# Patient Record
Sex: Male | Born: 1990 | Race: Black or African American | Hispanic: No | Marital: Single | Smoking: Never smoker
Health system: Southern US, Community
[De-identification: ages and names within clinical notes are randomized; demographics above are authoritative.]

## PROBLEM LIST (undated history)

## (undated) DIAGNOSIS — R066 Hiccough: Secondary | ICD-10-CM

## (undated) DIAGNOSIS — T7840XA Allergy, unspecified, initial encounter: Secondary | ICD-10-CM

## (undated) DIAGNOSIS — K219 Gastro-esophageal reflux disease without esophagitis: Secondary | ICD-10-CM

## (undated) HISTORY — DX: Hiccough: R06.6

## (undated) HISTORY — DX: Gastro-esophageal reflux disease without esophagitis: K21.9

## (undated) HISTORY — PX: NO PAST SURGERIES: SHX2092

## (undated) HISTORY — DX: Allergy, unspecified, initial encounter: T78.40XA

---
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
## 2011-02-09 ENCOUNTER — Other Ambulatory Visit (HOSPITAL_COMMUNITY): Payer: Self-pay | Admitting: Sports Medicine

## 2011-02-09 DIAGNOSIS — M25561 Pain in right knee: Secondary | ICD-10-CM

## 2011-02-12 ENCOUNTER — Inpatient Hospital Stay (HOSPITAL_COMMUNITY): Admission: RE | Admit: 2011-02-12 | Payer: 59 | Source: Ambulatory Visit

## 2011-03-02 ENCOUNTER — Ambulatory Visit (HOSPITAL_COMMUNITY)
Admission: RE | Admit: 2011-03-02 | Discharge: 2011-03-02 | Disposition: A | Discharge status: Home / Self Care | Payer: 59 | Source: Ambulatory Visit | Attending: Sports Medicine | Admitting: Sports Medicine

## 2011-03-02 DIAGNOSIS — M25561 Pain in right knee: Secondary | ICD-10-CM

## 2011-03-02 DIAGNOSIS — M25569 Pain in unspecified knee: Secondary | ICD-10-CM | POA: Insufficient documentation

## 2011-05-03 ENCOUNTER — Emergency Department (INDEPENDENT_AMBULATORY_CARE_PROVIDER_SITE_OTHER)
Admission: EM | Admit: 2011-05-03 | Discharge: 2011-05-03 | Disposition: A | Discharge status: Home / Self Care | Payer: 59 | Source: Home / Self Care | Attending: Emergency Medicine | Admitting: Emergency Medicine

## 2011-05-03 ENCOUNTER — Encounter (HOSPITAL_COMMUNITY): Payer: Self-pay | Admitting: *Deleted

## 2011-05-03 DIAGNOSIS — H6120 Impacted cerumen, unspecified ear: Secondary | ICD-10-CM

## 2011-05-03 MED ORDER — NEOMYCIN-POLYMYXIN-HC 3.5-10000-1 OT SUSP: 4.0000 [drp] | Freq: Three times a day (TID) | OTIC | Status: AC

## 2011-05-03 NOTE — ED Notes (Signed)
Pt is here with complaints of left ear pain, denies any additional complaints.

## 2011-05-03 NOTE — Discharge Instructions (Signed)
Cerumen Impaction A cerumen impaction is when the wax in your ear forms a plug. This plug usually causes reduced hearing. Sometimes it also causes an earache or dizziness. Removing a cerumen impaction can be difficult and painful. The wax sticks to the ear canal. The canal is sensitive and bleeds easily. If you try to remove a heavy wax buildup with a cotton tipped swab, you may push it in further. Irrigation with water, suction, and small ear curettes may be used to clear out the wax. If the impaction is fixed to the skin in the ear canal, ear drops may be needed for a few days to loosen the wax. People who build up a lot of wax frequently can use ear wax removal products available in your local drugstore. SEEK MEDICAL CARE IF:  You develop an earache, increased hearing loss, or marked dizziness. Document Released: 04/08/2004 Document Revised: 11/11/2010 Document Reviewed: 05/29/2009 ExitCare Patient Information 2012 [employer]. 

## 2011-05-03 NOTE — ED Provider Notes (Signed)
History     CSN: [id number]  Arrival date & time 05/03/11  0900   First MD Initiated Contact with Patient 05/03/11 [phone]      Chief Complaint  Patient presents with  . Otalgia    (Consider location/radiation/quality/duration/timing/severity/associated sxs/prior treatment) HPI Comments: Nicholas Newman presents to urgent care complaining of left ear discomfort "fullness sensation" for several weeks he describes that he tends to have earwax buildup and he tends to try to clean his ears everyday.  Denies any recent cold-like symptoms fevers or ear discharge. No trauma or  injury. No fevers  Patient is a 21 y.o. male presenting with ear pain. The history is provided by the patient.  Otalgia This is a new problem. The current episode started more than 1 week ago. There is pain in the left ear. The problem occurs constantly. The problem has not changed since onset.There has been no fever. Pertinent negatives include no ear discharge, no headaches, no rhinorrhea, no sore throat and no vomiting. His past medical history does not include chronic ear infection, hearing loss or tympanostomy tube.    History reviewed. No pertinent past medical history.  History reviewed. No pertinent past surgical history.  History reviewed. No pertinent family history.  History  Substance Use Topics  . Smoking status: Never Smoker   . Smokeless tobacco: Not on file  . Alcohol Use: No      Review of Systems  Constitutional: Negative for fever, activity change and appetite change.  HENT: Positive for ear pain. Negative for sore throat, rhinorrhea and ear discharge.   Gastrointestinal: Negative for vomiting.  Neurological: Negative for headaches.    Allergies  Review of patient's allergies indicates no known allergies.  Home Medications  No current outpatient prescriptions on file.  BP 110/73  Pulse 62  Temp(Src) 97.7 F (36.5 C) (Oral)  Resp 14  SpO2 100%  Physical Exam  Nursing note and vitals  reviewed. Constitutional: He appears well-developed and well-nourished. No distress.  HENT:  Head: Normocephalic.  Right Ear: No drainage or swelling. No decreased hearing is noted.  Left Ear: No drainage or swelling. No decreased hearing is noted.  Ears:  Eyes: Conjunctivae are normal.  Neck: Neck supple. No JVD present.  Abdominal: Soft.  Skin: Skin is warm.    ED Course  Procedures (including critical care time)  Labs Reviewed - No data to display No results found.   No diagnosis found.    MDM  Predominantly left ear canal cerumen impaction partial disruption of right ear canal.        Jimmie Molly, MD 05/03/11 1025

## 2012-08-19 ENCOUNTER — Emergency Department (HOSPITAL_COMMUNITY)
Admission: EM | Admit: 2012-08-19 | Discharge: 2012-08-19 | Disposition: A | Discharge status: Home / Self Care | Payer: BC Managed Care – PPO | Source: Home / Self Care | Attending: Emergency Medicine | Admitting: Emergency Medicine

## 2012-08-19 ENCOUNTER — Other Ambulatory Visit (HOSPITAL_COMMUNITY)
Admission: RE | Admit: 2012-08-19 | Discharge: 2012-08-19 | Disposition: A | Discharge status: Home / Self Care | Payer: BC Managed Care – PPO | Source: Ambulatory Visit | Attending: Emergency Medicine | Admitting: Emergency Medicine

## 2012-08-19 ENCOUNTER — Encounter (HOSPITAL_COMMUNITY): Payer: Self-pay

## 2012-08-19 DIAGNOSIS — R300 Dysuria: Secondary | ICD-10-CM

## 2012-08-19 DIAGNOSIS — Z113 Encounter for screening for infections with a predominantly sexual mode of transmission: Secondary | ICD-10-CM | POA: Insufficient documentation

## 2012-08-19 LAB — POCT URINALYSIS DIP (DEVICE)
Bilirubin Urine: NEGATIVE
Glucose, UA: NEGATIVE mg/dL
Leukocytes, UA: NEGATIVE
Nitrite: NEGATIVE
pH: 7 (ref 5.0–8.0)

## 2012-08-19 MED ORDER — PHENAZOPYRIDINE HCL 200 MG PO TABS: 200.0000 mg | ORAL_TABLET | Freq: Three times a day (TID) | ORAL | Status: DC | PRN

## 2012-08-19 NOTE — ED Provider Notes (Signed)
Chief Complaint:   Chief Complaint  Patient presents with  . Urinary Tract Infection    History of Present Illness:   Nicholas Newman is a 22 year old male who has had a three-day history of slight dysuria and pain at the tip of the penis. He denies any frequency, urgency, or hematuria. He has had no fever, chills, nausea, vomiting, lower abdominal, or lower back pain. He denies any urethral discharge. He has noted a few small bumps on his penis that appeared months ago. These have not been getting worse and has been no itching or irritation. The patient states he has not been sexually active since last December.  Review of Systems:  Other than noted above, the patient denies any of the following symptoms: General:  No fevers, chills, sweats, aches, or fatigue. GI:  No abdominal pain, back pain, nausea, vomiting, diarrhea, or constipation. GU:  No dysuria, frequency, urgency, hematuria, urethral discharge, penile lesions, penile pain, testicular pain, swelling, or mass, inguinal lymphadenopathy or incontinence.  PMFSH:  Past medical history, family history, social history, meds, and allergies were reviewed.   Physical Exam:   Vital signs:  BP 126/74  Pulse 67  Temp(Src) 98 F (36.7 C) (Oral)  Resp 17  SpO2 98% Gen:  Alert, oriented, in no distress. Lungs:  Clear to auscultation, no wheezes, rales or rhonchi. Heart:  Regular rhythm, no gallop or murmer. Abdomen:  Flat and soft.  No tenderness to palpation, guarding, or rebound.  No hepato-splenomegaly or mass.  Bowel sounds were normally active.  No hernia. Genital exam:  No urethral discharge. He has some small, light colored bumps on both sides of the penis. These do not look like warts or molluscum. I'm not sure what they are. Back:  No CVA tenderness.  Skin:  Clear, warm and dry.  Labs:   Results for orders placed during the hospital encounter of 08/19/12  POCT URINALYSIS DIP (DEVICE)      Result Value Range   Glucose, UA  NEGATIVE  NEGATIVE mg/dL   Bilirubin Urine NEGATIVE  NEGATIVE   Ketones, ur NEGATIVE  NEGATIVE mg/dL   Specific Gravity, Urine 1.015  1.005 - 1.030   Hgb urine dipstick NEGATIVE  NEGATIVE   pH 7.0  5.0 - 8.0   Protein, ur NEGATIVE  NEGATIVE mg/dL   Urobilinogen, UA 0.2  0.0 - 1.0 mg/dL   Nitrite NEGATIVE  NEGATIVE   Leukocytes, UA NEGATIVE  NEGATIVE    A urine culture was obtained as well as DNA probe for gonorrhea, Chlamydia, Trichomonas.  Assessment: The encounter diagnosis was Dysuria.   No evidence of urinary tract infection. We'll treat symptomatically with Pyridium, increase in fluids, and avoidance of bladder irritants. Return again if symptoms persist.  Plan:   1.  The following meds were prescribed:   Discharge Medication List as of 08/19/2012  3:11 PM    START taking these medications   Details  phenazopyridine (PYRIDIUM) 200 MG tablet Take 1 tablet (200 mg total) by mouth 3 (three) times daily as needed for pain., Starting 08/19/2012, Until Discontinued, Normal       2.  The patient was instructed in symptomatic care and handouts were given. 3.  The patient was told to return if becoming worse in any way, if no better in 3 or 4 days, and given some red flag symptoms such as fever, vomiting, or back pain that would indicate earlier return. 4.  Follow up here as necessary.     Onalee Hua  Vivia Budge, MD 08/19/12 [phone]

## 2012-08-19 NOTE — ED Notes (Signed)
C/o 2nd day duration if pain w urination; denies sexual activity since 02-2012

## 2012-08-20 LAB — URINE CULTURE
Colony Count: NO GROWTH
Culture: NO GROWTH

## 2012-10-24 ENCOUNTER — Encounter: Payer: BC Managed Care – PPO | Admitting: Family Medicine

## 2012-10-24 DIAGNOSIS — Z0289 Encounter for other administrative examinations: Secondary | ICD-10-CM

## 2012-10-25 ENCOUNTER — Telehealth: Payer: Self-pay

## 2012-10-25 NOTE — Telephone Encounter (Signed)
Called and left message informing the patient that he missed his appointment

## 2013-10-21 ENCOUNTER — Emergency Department (INDEPENDENT_AMBULATORY_CARE_PROVIDER_SITE_OTHER): Payer: Managed Care, Other (non HMO)

## 2013-10-21 ENCOUNTER — Encounter (HOSPITAL_COMMUNITY): Payer: Self-pay | Admitting: Emergency Medicine

## 2013-10-21 ENCOUNTER — Emergency Department (HOSPITAL_COMMUNITY)
Admission: EM | Admit: 2013-10-21 | Discharge: 2013-10-21 | Disposition: A | Discharge status: Home / Self Care | Payer: Managed Care, Other (non HMO) | Source: Home / Self Care | Attending: Emergency Medicine | Admitting: Emergency Medicine

## 2013-10-21 DIAGNOSIS — K2210 Ulcer of esophagus without bleeding: Secondary | ICD-10-CM

## 2013-10-21 MED ORDER — GI COCKTAIL ~~LOC~~
30.0000 mL | Freq: Once | ORAL | Status: AC
  Administered 2013-10-21: 30 mL via ORAL

## 2013-10-21 MED ORDER — SUCRALFATE 1 GM/10ML PO SUSP: 1.0000 g | Freq: Four times a day (QID) | ORAL | Status: DC

## 2013-10-21 MED ORDER — GI COCKTAIL ~~LOC~~
ORAL | Status: AC
  Filled 2013-10-21: qty 30

## 2013-10-21 NOTE — ED Notes (Signed)
C/o allergic reaction to antibiotic medication States received meds at hospital when he went for cyst removal States he is sob and has chest tightness meds was started on Tuesday and reaction started on Thursday Denies any vomiting and diarrhea

## 2013-10-21 NOTE — ED Provider Notes (Signed)
Chief Complaint   Chief Complaint  Patient presents with  . Allergic Reaction    History of Present Illness    Nicholas Newman is a 23 year old male who was on doxycycline for an infected cyst on his right flank area. This was incised and drained 6 days ago. He had been taking the doxycycline since then. Wednesday he dry swallows one of the doxycycline capsules. He did not feel it get caught in his esophagus. On Friday night which was 3 days ago he developed chest pain in the lower sternal area, this was rated 10 over 10 in intensity. He felt mildly short of breath. He noted that it hurt to swallow food or liquids. He denies any fever, chills, sore throat, coughing, wheezing, palpitations, dizziness, or syncope. No nausea or diaphoresis. No abdominal pain or vomiting.  Review of Systems    Other than noted above, the patient denies any of the following symptoms. Systemic:  No fever or chills. Pulmonary:  No cough, wheezing, shortness of breath, sputum production, hemoptysis. Cardiac:  No palpitations, rapid heartbeat, dizziness, presyncope or syncope. GI:  No abdominal pain, heartburn, nausea, or vomiting. Ext:  No leg pain or swelling.  [place]    Past medical history, family history, social history, meds, and allergies were reviewed.   Physical Exam     Vital signs:  BP 127/76  Pulse 71  Temp(Src) 98.8 F (37.1 C) (Oral)  Resp 16  SpO2 96% Gen:  Alert, oriented, in no distress, skin warm and dry. Eye:  PERRL, lids and conjunctivas normal.  Sclera non-icteric. ENT:  Mucous membranes moist, pharynx clear. Neck:  Supple, no adenopathy or tenderness.  No JVD. Lungs:  Clear to auscultation, no wheezes, rales or rhonchi.  No respiratory distress. Heart:  Regular rhythm.  No gallops, murmers, clicks or rubs. Chest:  No chest wall tenderness. Abdomen:  Soft, nontender, no organomegaly or mass.  Bowel sounds normal.  No pulsatile abdominal mass or bruit. Ext:  No edema.  No  calf tenderness and Homann's sign negative.  Pulses full and equal. Skin:  Warm and dry.  No rash.   Radiology     Dg Chest 2 View  10/21/2013   CLINICAL DATA:  Shortness of breath and chest tightness for 3 days.  EXAM: CHEST  2 VIEW  COMPARISON:  None.  FINDINGS: The heart size and mediastinal contours are within normal limits. Both lungs are clear. The visualized skeletal structures are unremarkable.  IMPRESSION: No active cardiopulmonary disease.   Electronically Signed   By: Shon Hale M.D.   On: 10/21/2013 13:22   I reviewed the images independently and personally and concur with the radiologist's findings.  EKG Results:  Date: 10/21/2013  Rate: 74  Rhythm: normal sinus rhythm  QRS Axis: normal--82  Intervals: normal  ST/T Wave abnormalities: normal  Conduction Disutrbances:none  Narrative Interpretation: Normal sinus rhythm, normal EKG.  Old EKG Reviewed: none available    Course in Urgent [place]         He was given 30 mL of GI cocktail with complete relief of his pain.  Assessment     The encounter diagnosis was Esophageal ulcer.  Probable esophageal ulcer from a dry swallowing a doxycycline capsule.  Plan     1.  Meds:  The following meds were prescribed:   Discharge Medication List as of 10/21/2013  1:36 PM    START taking these medications   Details  sucralfate (CARAFATE) 1 GM/10ML suspension Take 10 mLs (1 g total) by mouth 4 (four) times daily., Starting 10/21/2013, Until Discontinued, Normal        2.  Patient Education/Counseling:  The patient was given appropriate handouts, self care instructions, and instructed in symptomatic relief.  Given dietary instructions.  3.  Follow up:  The patient was told to follow up here if no better in 3 to 4 days, or sooner if becoming worse in any way, and give an an some  red flag symptoms such as worsening pain, shortness of breath, dizziness, or passing out which would prompt immediate return.      Harden Mo, MD 10/21/13 [phone]

## 2013-10-21 NOTE — Discharge Instructions (Signed)
Food Choices for Gastroesophageal Reflux Disease When you have gastroesophageal reflux disease (GERD), the foods you eat and your eating habits are very important. Choosing the right foods can help ease the discomfort of GERD. WHAT GENERAL GUIDELINES DO I NEED TO FOLLOW?  Choose fruits, vegetables, whole grains, low-fat dairy products, and low-fat meat, fish, and poultry.  Limit fats such as oils, salad dressings, butter, nuts, and avocado.  Keep a food diary to identify foods that cause symptoms.  Avoid foods that cause reflux. These may be different for different people.  Eat frequent small meals instead of three large meals each day.  Eat your meals slowly, in a relaxed setting.  Limit fried foods.  Cook foods using methods other than frying.  Avoid drinking alcohol.  Avoid drinking large amounts of liquids with your meals.  Avoid bending over or lying down until 2-3 hours after eating. WHAT FOODS ARE NOT RECOMMENDED? The following are some foods and drinks that may worsen your symptoms: Vegetables Tomatoes. Tomato juice. Tomato and spaghetti sauce. Chili peppers. Onion and garlic. Horseradish. Fruits Oranges, grapefruit, and lemon (fruit and juice). Meats High-fat meats, fish, and poultry. This includes hot dogs, ribs, ham, sausage, salami, and bacon. Dairy Whole milk and chocolate milk. Sour cream. Cream. Butter. Ice cream. Cream cheese.  Beverages Coffee and tea, with or without caffeine. Carbonated beverages or energy drinks. Condiments Hot sauce. Barbecue sauce.  Sweets/Desserts Chocolate and cocoa. Donuts. Peppermint and spearmint. Fats and Oils High-fat foods, including [place] fries and potato chips. Other Vinegar. Strong spices, such as black pepper, white pepper, red pepper, cayenne, curry powder, cloves, ginger, and chili powder. The items listed above may not be a complete list of foods and beverages to avoid. Contact your dietitian for more  information. Document Released: 03/01/2005 Document Revised: 03/06/2013 Document Reviewed: 01/03/2013 [employer] Patient Information 2015 [place], [place]. This information is not intended to replace advice given to you by your health care provider. Make sure you discuss any questions you have with your health care provider.   Stop doxycyline for now, until this has healed up.  You may take in future if needed, but be sure to swallow with a full glass of water.

## 2014-11-28 ENCOUNTER — Emergency Department (HOSPITAL_COMMUNITY)
Admission: EM | Admit: 2014-11-28 | Discharge: 2014-11-29 | Disposition: A | Discharge status: Home / Self Care | Payer: No Typology Code available for payment source | Attending: Emergency Medicine | Admitting: Emergency Medicine

## 2014-11-28 ENCOUNTER — Emergency Department (HOSPITAL_COMMUNITY): Payer: No Typology Code available for payment source

## 2014-11-28 ENCOUNTER — Encounter (HOSPITAL_COMMUNITY): Payer: Self-pay | Admitting: Emergency Medicine

## 2014-11-28 DIAGNOSIS — S8002XA Contusion of left knee, initial encounter: Secondary | ICD-10-CM | POA: Diagnosis not present

## 2014-11-28 DIAGNOSIS — Y9389 Activity, other specified: Secondary | ICD-10-CM | POA: Diagnosis not present

## 2014-11-28 DIAGNOSIS — S5002XA Contusion of left elbow, initial encounter: Secondary | ICD-10-CM

## 2014-11-28 DIAGNOSIS — Z79899 Other long term (current) drug therapy: Secondary | ICD-10-CM | POA: Insufficient documentation

## 2014-11-28 DIAGNOSIS — Y92410 Unspecified street and highway as the place of occurrence of the external cause: Secondary | ICD-10-CM | POA: Insufficient documentation

## 2014-11-28 DIAGNOSIS — Y998 Other external cause status: Secondary | ICD-10-CM | POA: Insufficient documentation

## 2014-11-28 DIAGNOSIS — S39012A Strain of muscle, fascia and tendon of lower back, initial encounter: Secondary | ICD-10-CM | POA: Diagnosis not present

## 2014-11-28 DIAGNOSIS — S8001XA Contusion of right knee, initial encounter: Secondary | ICD-10-CM | POA: Diagnosis not present

## 2014-11-28 DIAGNOSIS — S3992XA Unspecified injury of lower back, initial encounter: Secondary | ICD-10-CM | POA: Diagnosis present

## 2014-11-28 MED ORDER — IBUPROFEN 800 MG PO TABS: 800.0000 mg | ORAL_TABLET | Freq: Three times a day (TID) | ORAL | Status: DC | PRN

## 2014-11-28 MED ORDER — HYDROCODONE-ACETAMINOPHEN 5-325 MG PO TABS: 1.0000 | ORAL_TABLET | Freq: Four times a day (QID) | ORAL | Status: DC | PRN

## 2014-11-28 NOTE — ED Notes (Signed)
Pt transported to xray 

## 2014-11-28 NOTE — ED Notes (Signed)
Pt stable, ambulatory, states understanding of discharge instructions 
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
## 2014-11-28 NOTE — Discharge Instructions (Signed)
Return here as needed.  Your x-rays did not show any abnormalities.  Use ice and heat on the areas that are sore

## 2015-04-11 ENCOUNTER — Emergency Department (HOSPITAL_COMMUNITY)
Admission: EM | Admit: 2015-04-11 | Discharge: 2015-04-12 | Disposition: A | Discharge status: Home / Self Care | Payer: Managed Care, Other (non HMO) | Attending: Emergency Medicine | Admitting: Emergency Medicine

## 2015-04-11 ENCOUNTER — Encounter (HOSPITAL_COMMUNITY): Payer: Self-pay | Admitting: *Deleted

## 2015-04-11 DIAGNOSIS — R300 Dysuria: Secondary | ICD-10-CM | POA: Diagnosis present

## 2015-04-11 DIAGNOSIS — N410 Acute prostatitis: Secondary | ICD-10-CM | POA: Insufficient documentation

## 2015-04-11 LAB — URINALYSIS, ROUTINE W REFLEX MICROSCOPIC
Bilirubin Urine: NEGATIVE
GLUCOSE, UA: NEGATIVE mg/dL
Hgb urine dipstick: NEGATIVE
KETONES UR: NEGATIVE mg/dL
LEUKOCYTES UA: NEGATIVE
NITRITE: NEGATIVE
PROTEIN: NEGATIVE mg/dL
Specific Gravity, Urine: 1.008 (ref 1.005–1.030)
pH: 7 (ref 5.0–8.0)

## 2015-04-11 NOTE — ED Notes (Signed)
The pt is c/o painfil urination just after he finishes voiding  For 2 days he was seen Tuesday by a doctor  That took a urine specimen  Only protein showed up.  He feels fine if he drinks   A lot of water

## 2015-04-11 NOTE — ED Provider Notes (Signed)
CSN: [id number]     Arrival date & time 04/11/15  2157 History  By signing my name below, I, Dora Sims, attest that this documentation has been prepared under the direction and in the presence of Orpah Greek, MD . Electronically Signed: Dora Sims, Scribe. 04/12/2015. 12:22 AM.    Chief Complaint  Patient presents with  . Dysuria     HPI   HPI Comments: Nicholas Newman is a 25 y.o. male w/o pertinent PMHx who presents to the Emergency Department complaining of sudden onset painful urination for the last 2 days. He reports that he does not experience pain during urination, but after urinating he reports an uncomfortable stinging sensation. He reports going to Urgent Care 2 days ago and states that only protein showed up in his urine sample  Pt states that he has been drinking water constantly throughout the day. Pt denies any hematuria, swelling to the area, abnormal discharge, or any other associated symptoms at this time.    History reviewed. No pertinent past medical history. History reviewed. No pertinent past surgical history. No family history on file. Social History  Substance Use Topics  . Smoking status: Never Smoker   . Smokeless tobacco: None  . Alcohol Use: Yes    Review of Systems  Constitutional: Negative for fever and chills.  Genitourinary: Positive for dysuria. Negative for hematuria, discharge and penile swelling.  All other systems reviewed and are negative.     Allergies  Doxycycline  Home Medications   Prior to Admission medications   Medication Sig Start Date End Date Taking? Authorizing Provider  ciprofloxacin (CIPRO) 500 MG tablet Take 1 tablet (500 mg total) by mouth 2 (two) times daily. 04/12/15   Orpah Greek, MD  HYDROcodone-acetaminophen (NORCO/VICODIN) 5-325 MG per tablet Take 1 tablet by mouth every 6 (six) hours as needed for moderate pain. Patient not taking: Reported on 04/11/2015 11/28/14   Dalia Heading,  PA-C  ibuprofen (ADVIL,MOTRIN) 800 MG tablet Take 1 tablet (800 mg total) by mouth every 8 (eight) hours as needed. Patient not taking: Reported on 04/11/2015 11/28/14   Dalia Heading, PA-C  phenazopyridine (PYRIDIUM) 200 MG tablet Take 1 tablet (200 mg total) by mouth 3 (three) times daily as needed for pain. Patient not taking: Reported on 04/11/2015 08/19/12   Harden Mo, MD  sucralfate (CARAFATE) 1 GM/10ML suspension Take 10 mLs (1 g total) by mouth 4 (four) times daily. Patient not taking: Reported on 04/11/2015 10/21/13   Harden Mo, MD   BP 145/84 mmHg  Pulse 83  Temp(Src) 98 F (36.7 C) (Oral)  Resp 16  SpO2 99% Physical Exam  Constitutional: He is oriented to person, place, and time. He appears well-developed and well-nourished. No distress.  HENT:  Head: Normocephalic and atraumatic.  Right Ear: Hearing normal.  Left Ear: Hearing normal.  Nose: Nose normal.  Mouth/Throat: Oropharynx is clear and moist and mucous membranes are normal.  Eyes: Conjunctivae and EOM are normal. Pupils are equal, round, and reactive to light.  Neck: Normal range of motion. Neck supple.  Cardiovascular: Regular rhythm, S1 normal and S2 normal.  Exam reveals no gallop and no friction rub.   No murmur heard. Pulmonary/Chest: Effort normal and breath sounds normal. No respiratory distress. He exhibits no tenderness.  Abdominal: Soft. Normal appearance and bowel sounds are normal. There is no hepatosplenomegaly. There is no tenderness. There is no rebound, no guarding, no tenderness at McBurney's point and negative Murphy's sign. No hernia.  Hernia confirmed negative in the right inguinal area and confirmed negative in the left inguinal area.  Genitourinary: Rectum normal, testes normal and penis normal. Right testis shows no mass and no tenderness. Left testis shows no mass and no tenderness. No penile erythema. No discharge found.  Enlarged soft body Tender prostate  Musculoskeletal: Normal range  of motion.  Neurological: He is alert and oriented to person, place, and time. He has normal strength. No cranial nerve deficit or sensory deficit. Coordination normal. GCS eye subscore is 4. GCS verbal subscore is 5. GCS motor subscore is 6.  Skin: Skin is warm, dry and intact. No rash noted. No cyanosis.  Psychiatric: He has a normal mood and affect. His speech is normal and behavior is normal. Thought content normal.  Nursing note and vitals reviewed.   ED Course  Procedures (including critical care time)  DIAGNOSTIC STUDIES: Oxygen Saturation is 100% on RA, normal by my interpretation.    COORDINATION OF CARE:  12:14 AM Will be referred to a urologist. Urinalysis ordered. Discussed treatment plan with pt at bedside and pt agreed to plan.   Labs Review Labs Reviewed  URINALYSIS, ROUTINE W REFLEX MICROSCOPIC (NOT AT [facility])  GC/CHLAMYDIA PROBE AMP ([place]) NOT AT [facility]    Imaging Review No results found. I have personally reviewed and evaluated these images and lab results as part of my medical decision-making.   EKG Interpretation None      MDM   Final diagnoses:  Acute prostatitis    Presents to emergency department for evaluation of painful urination. This reports the symptoms have been ongoing for several days. He reports mild discomfort and apparent strain and upon finishing urinating. He was seen at urgent care several days ago, has not improved.  Analysis is normal here. He does not have any urethral discharge or obvious abnormalities on genital exam. Rectal examination, however, does show a somewhat enlarged, boggy and tender prostate. Patient will be treated for prostatitis. He has a doxycycline allergy. Was treated with Rocephin and Zithromax in the ER, will prescribe 14 day course of Cipro and have follow-up with urology.  I personally performed the services described in this documentation, which was scribed in my presence. The recorded information has been  reviewed and is accurate.      Orpah Greek, MD 04/12/15 Benancio Deeds

## 2015-04-12 MED ORDER — LIDOCAINE HCL (PF) 1 % IJ SOLN
INTRAMUSCULAR | Status: AC
  Administered 2015-04-12: 0.9 mL
  Filled 2015-04-12: qty 5

## 2015-04-12 MED ORDER — CIPROFLOXACIN HCL 500 MG PO TABS: 500.0000 mg | ORAL_TABLET | Freq: Two times a day (BID) | ORAL | Status: DC

## 2015-04-12 MED ORDER — AZITHROMYCIN 250 MG PO TABS
1000.0000 mg | ORAL_TABLET | Freq: Once | ORAL | Status: AC
  Administered 2015-04-12: 1000 mg via ORAL
  Filled 2015-04-12: qty 4

## 2015-04-12 MED ORDER — CEFTRIAXONE SODIUM 250 MG IJ SOLR
250.0000 mg | Freq: Once | INTRAMUSCULAR | Status: AC
  Administered 2015-04-12: 250 mg via INTRAMUSCULAR
  Filled 2015-04-12: qty 250

## 2015-04-12 NOTE — Discharge Instructions (Signed)

## 2015-04-27 ENCOUNTER — Encounter (HOSPITAL_COMMUNITY): Payer: Self-pay

## 2015-04-27 ENCOUNTER — Emergency Department (HOSPITAL_COMMUNITY)
Admission: EM | Admit: 2015-04-27 | Discharge: 2015-04-28 | Disposition: A | Discharge status: Home / Self Care | Payer: Managed Care, Other (non HMO) | Attending: Emergency Medicine | Admitting: Emergency Medicine

## 2015-04-27 DIAGNOSIS — Z792 Long term (current) use of antibiotics: Secondary | ICD-10-CM | POA: Insufficient documentation

## 2015-04-27 DIAGNOSIS — R1030 Lower abdominal pain, unspecified: Secondary | ICD-10-CM | POA: Diagnosis present

## 2015-04-27 DIAGNOSIS — Z79899 Other long term (current) drug therapy: Secondary | ICD-10-CM | POA: Insufficient documentation

## 2015-04-27 DIAGNOSIS — Z87438 Personal history of other diseases of male genital organs: Secondary | ICD-10-CM | POA: Diagnosis not present

## 2015-04-27 DIAGNOSIS — R300 Dysuria: Secondary | ICD-10-CM | POA: Diagnosis not present

## 2015-04-27 DIAGNOSIS — R350 Frequency of micturition: Secondary | ICD-10-CM | POA: Insufficient documentation

## 2015-04-27 NOTE — ED Provider Notes (Signed)
CSN: [id number]     Arrival date & time 04/27/15  1804 History   First MD Initiated Contact with Patient 04/27/15 2337     Chief Complaint  Patient presents with  . Abdominal Pain     (Consider location/radiation/quality/duration/timing/severity/associated sxs/prior Treatment) HPI   25 year old male who presents complaining of low abdominal pain and urinary discomfort. Patient mentioned 3 weeks ago he was having some mild discomfort after urinating. He went to urgent care initially for evaluation and was told to monitor his condition and return worsen. Several days later he went to the ED on 1/27 for this complaint. Was diagnosed with having acute prostatitis and prescribe Cipro for 14 days. He recall having a rectal exam and did not have any significant discomfort on rectal exam and his urine at that time did not show any obvious has of infection. He did take his antibiotic as prescribed and recently finished 2 days ago. During the tablet he took his antibiotic he did not notice any significant discomfort however yesterday he once again noticed some mild discomfort to his low abdomen along with mild discomfort when he urinates. He admits to having some urinary frequency. There is no associated penile discharge or rash. No rectal pain or having bowel movement. No fever, chills, nausea vomiting diarrhea, pain with eating, or loss of appetite. He does work out a regular basis but does not correlate his abdominal pain with muscle pain from working out. He essentially active with one partner. No prior history of STD.  History reviewed. No pertinent past medical history. History reviewed. No pertinent past surgical history. History reviewed. No pertinent family history. Social History  Substance Use Topics  . Smoking status: Never Smoker   . Smokeless tobacco: None  . Alcohol Use: Yes    Review of Systems  All other systems reviewed and are negative.     Allergies  Doxycycline  Home  Medications   Prior to Admission medications   Medication Sig Start Date End Date Taking? Authorizing Provider  ciprofloxacin (CIPRO) 500 MG tablet Take 1 tablet (500 mg total) by mouth 2 (two) times daily. 04/12/15   Orpah Greek, MD  HYDROcodone-acetaminophen (NORCO/VICODIN) 5-325 MG per tablet Take 1 tablet by mouth every 6 (six) hours as needed for moderate pain. Patient not taking: Reported on 04/11/2015 11/28/14   Dalia Heading, PA-C  ibuprofen (ADVIL,MOTRIN) 800 MG tablet Take 1 tablet (800 mg total) by mouth every 8 (eight) hours as needed. Patient not taking: Reported on 04/11/2015 11/28/14   Dalia Heading, PA-C  phenazopyridine (PYRIDIUM) 200 MG tablet Take 1 tablet (200 mg total) by mouth 3 (three) times daily as needed for pain. Patient not taking: Reported on 04/11/2015 08/19/12   Harden Mo, MD  sucralfate (CARAFATE) 1 GM/10ML suspension Take 10 mLs (1 g total) by mouth 4 (four) times daily. Patient not taking: Reported on 04/11/2015 10/21/13   Harden Mo, MD   BP 147/74 mmHg  Pulse 74  Temp(Src) 98.5 F (36.9 C) (Oral)  Resp 20  SpO2 99% Physical Exam  Constitutional: He appears well-developed and well-nourished. No distress.  Well appearing African-American male in no acute discomfort.  HENT:  Head: Atraumatic.  Eyes: Conjunctivae are normal.  Neck: Neck supple.  Cardiovascular: Normal rate and regular rhythm.   Pulmonary/Chest: Effort normal and breath sounds normal.  Abdominal: Soft. Bowel sounds are normal. He exhibits no distension. There is no tenderness. There is no rebound and no guarding.  Abdomen is soft and  nontender. Negative Murphy sign, no pain at McBurney's point.  No CVA tenderness  Genitourinary:  Chaperone present during exam. No inguinal lymphadenopathy or inguinal hernia noted. Normal circumcised penis free of lesion or rash. Testicles are nontender to palpation with normal lie and normal scrotum. On digital rectal exam: Normal  prostate that is nontender and appears to be normal size. Patient with normal rectal tone, no mass, normal color stool.  Neurological: He is alert. A cranial nerve deficit is present.  Skin: No rash noted.  Psychiatric: He has a normal mood and affect.  Nursing note and vitals reviewed.   ED Course  Procedures (including critical care time) Labs Review Labs Reviewed  URINALYSIS, ROUTINE W REFLEX MICROSCOPIC (NOT AT [facility]) - Abnormal; Notable for the following:    Ketones, ur 15 (*)    All other components within normal limits  URINE CULTURE  RAPID HIV SCREEN (HIV 1/2 AB+AG)  RPR  GC/CHLAMYDIA PROBE AMP ([place]) NOT AT [facility]    Imaging Review No results found. I have personally reviewed and evaluated these images and lab results as part of my medical decision-making.   EKG Interpretation None      MDM   Final diagnoses:  Dysuria    BP 150/89 mmHg  Pulse 74  Temp(Src) 98.5 F (36.9 C) (Oral)  Resp 20  SpO2 100%   12:16 AM Patient report having mild low abdominal discomfort and mild dysuria which is waxing waning for nearly a month. He has a benign abdominal exam and a normal exam of his penis and testicle.  No prostate tenderness and prostate is of normal size.  He does not have any postprandial pain. He does not have any nausea vomiting diarrhea to suggest GI pathology. Patient states he exercises regularly and it did not bother him.  12:19 AM UA shows no signs of urinary tract infection. GC and chlamydia culture sent along with HIV and RPR.  Pt agrees to f/u with urologist for further care.  outpt resources provided.  Although i do not see any obvious signs of infection, pt requesting for cipro as it has helped previously.  I will give 1 week course of Cipro.  Urine culture sent.   Domenic Moras, PA-C 04/28/15 [id number]  Sherwood Gambler, MD 04/28/15 [phone]

## 2015-04-27 NOTE — ED Notes (Signed)
Pt reports was seen in ED 04-11-15 for abd discomfort after urinating, dx with acute prostatitis and prescribed Cipro.  Last dose of Cipro was 2 days ago.  Pt has been symptom free until today.  No penile discharge.

## 2015-04-28 LAB — RAPID HIV SCREEN (HIV 1/2 AB+AG)
HIV 1/2 Antibodies: NONREACTIVE
HIV-1 P24 Antigen - HIV24: NONREACTIVE

## 2015-04-28 LAB — URINALYSIS, ROUTINE W REFLEX MICROSCOPIC
BILIRUBIN URINE: NEGATIVE
Glucose, UA: NEGATIVE mg/dL
Hgb urine dipstick: NEGATIVE
KETONES UR: 15 mg/dL — AB
LEUKOCYTES UA: NEGATIVE
NITRITE: NEGATIVE
PROTEIN: NEGATIVE mg/dL
Specific Gravity, Urine: 1.012 (ref 1.005–1.030)
pH: 7 (ref 5.0–8.0)

## 2015-04-28 LAB — GC/CHLAMYDIA PROBE AMP (~~LOC~~) NOT AT ARMC
CHLAMYDIA, DNA PROBE: NEGATIVE
NEISSERIA GONORRHEA: NEGATIVE

## 2015-04-28 LAB — RPR: RPR Ser Ql: NONREACTIVE

## 2015-04-28 MED ORDER — CIPROFLOXACIN HCL 500 MG PO TABS: 500.0000 mg | ORAL_TABLET | Freq: Two times a day (BID) | ORAL | Status: DC

## 2015-04-28 NOTE — Discharge Instructions (Signed)

## 2015-04-28 NOTE — ED Notes (Signed)
Pt left at this time with all belongings.  

## 2015-04-29 LAB — URINE CULTURE: CULTURE: NO GROWTH

## 2016-08-28 ENCOUNTER — Encounter (HOSPITAL_COMMUNITY): Payer: Self-pay

## 2016-08-28 DIAGNOSIS — K625 Hemorrhage of anus and rectum: Secondary | ICD-10-CM | POA: Diagnosis not present

## 2016-08-28 DIAGNOSIS — K921 Melena: Secondary | ICD-10-CM | POA: Diagnosis present

## 2016-08-28 DIAGNOSIS — K649 Unspecified hemorrhoids: Secondary | ICD-10-CM | POA: Insufficient documentation

## 2016-08-28 NOTE — ED Triage Notes (Signed)
Pt complaining of blood in stool. Pt states increased straining recently. Pt states lifts weights. Pt denies any abdominal pain or urinary symptoms. Pt states some tenderness with bowel movement.

## 2016-08-29 ENCOUNTER — Emergency Department (HOSPITAL_COMMUNITY)
Admission: EM | Admit: 2016-08-29 | Discharge: 2016-08-29 | Disposition: A | Discharge status: Home / Self Care | Payer: Managed Care, Other (non HMO) | Attending: Emergency Medicine | Admitting: Emergency Medicine

## 2016-08-29 DIAGNOSIS — K625 Hemorrhage of anus and rectum: Secondary | ICD-10-CM

## 2016-08-29 DIAGNOSIS — K649 Unspecified hemorrhoids: Secondary | ICD-10-CM

## 2016-08-29 LAB — I-STAT CHEM 8, ED
BUN: 16 mg/dL (ref 6–20)
CALCIUM ION: 1.2 mmol/L (ref 1.15–1.40)
CHLORIDE: 100 mmol/L — AB (ref 101–111)
CREATININE: 1.4 mg/dL — AB (ref 0.61–1.24)
GLUCOSE: 96 mg/dL (ref 65–99)
HCT: 43 % (ref 39.0–52.0)
Hemoglobin: 14.6 g/dL (ref 13.0–17.0)
Potassium: 3.7 mmol/L (ref 3.5–5.1)
Sodium: 139 mmol/L (ref 135–145)
TCO2: 33 mmol/L (ref 0–100)

## 2016-08-29 LAB — POC OCCULT BLOOD, ED: Fecal Occult Bld: POSITIVE — AB

## 2016-08-29 NOTE — Discharge Instructions (Signed)
It was my pleasure taking care of you today!   Please call the urology clinic to schedule a follow up appointment for prostate check.  Increase hydration and follow a high fiber diet. Use a stool softener such as Colace to help relieve pain with bowel movements. Sitz baths and Epsom salt baths will also help improve symptoms.   If symptoms persist longer than 3-4 days, please follow up with your primary care provider. If you do not have one, see the information below. If you cannot get into the primary doctor and your symptoms persist, you may return to ER.  Please return to ER for new or worsening symptoms, any additional concerns.   To find a primary care or specialty doctor please call [phone] or [phone] to access "[facility] a Doctor Service."  You may also go on the [facility] website at [email or web address]  There are also multiple [facility], [facility] and [facility] practices throughout the [place] that are frequently accepting new patients. You may find a clinic that is close to your home and contact them.  [facility] [place] [phone]  [facility] in [place] (also locations in [place] and [place]) - [place] [phone]  [place] [phone]

## 2016-08-29 NOTE — ED Notes (Signed)
Pt reports working on a car yesterday and not drinking any water yesterday. Pt went inside and had a bowel movement with some bright red blood. Pt got scared and came to the ED.

## 2016-08-29 NOTE — ED Notes (Signed)
Spoke to patient at length about current concerns, waiting for a room, and return precautions if patient insists on leaving.

## 2016-08-29 NOTE — ED Provider Notes (Signed)
[facility] DEPT Provider Note   CSN: [id number] Arrival date & time: 08/28/16  2312     History   Chief Complaint Chief Complaint  Patient presents with  . Blood In Stools    HPI Nicholas Newman is a 26 y.o. male.  The history is provided by the patient and medical records. No language interpreter was used.   Nicholas Newman is a 26 y.o. male  with no pertinent PMH who presents to the Emergency Department complaining of bright red blood per rectum x 1 episode which occurred tonight at 10:30 pm. Patient states he went to the restroom to have BM tonight and had a typical, soft stool with little straining. When he wiped, he noticed bright red blood on toilet paper. He then looked into the toilet bowl and noticed bright red blood droplets in the toilet bowl. He states there was no blood in the stool and it looked like a light brown. He brought a picture of the toilet bowl which shows light brown stool with several areas of bright red blood droplets / steaks in the toilet bowl. Blood did not fill up toilet. No history of similar. He does do a good bit of heavy lifting and has had a hemorrhoid in the past due to this. He states that this feels different as the hemorrhoid was painful and he did not notice any bleeding. He is having no pain whatsoever. No rectal pain or abdominal pain. No dysuria, penile discharge, scrotal/testicular pain. No medications or treatments prior to arrival. No BM since this initial episode. No more blood noted either.   History reviewed. No pertinent past medical history.  Patient Active Problem List   Diagnosis Date Noted  . Foot pain 07/31/2010  . Gait abnormality 07/31/2010  . 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 PES PLANUS 02/11/2010    History reviewed. No pertinent surgical history.     Home Medications    Prior to Admission medications   Medication Sig Start Date End Date Taking? Authorizing Provider  ciprofloxacin (CIPRO) 500  MG tablet Take 1 tablet (500 mg total) by mouth 2 (two) times daily. Patient not taking: Reported on 08/29/2016 04/28/15   Domenic Moras, PA-C  HYDROcodone-acetaminophen (NORCO/VICODIN) 5-325 MG per tablet Take 1 tablet by mouth every 6 (six) hours as needed for moderate pain. Patient not taking: Reported on 04/11/2015 11/28/14   Dalia Heading, PA-C  ibuprofen (ADVIL,MOTRIN) 800 MG tablet Take 1 tablet (800 mg total) by mouth every 8 (eight) hours as needed. Patient not taking: Reported on 04/11/2015 11/28/14   Dalia Heading, PA-C  phenazopyridine (PYRIDIUM) 200 MG tablet Take 1 tablet (200 mg total) by mouth 3 (three) times daily as needed for pain. Patient not taking: Reported on 04/11/2015 08/19/12   Harden Mo, MD  sucralfate (CARAFATE) 1 GM/10ML suspension Take 10 mLs (1 g total) by mouth 4 (four) times daily. Patient not taking: Reported on 04/11/2015 10/21/13   Harden Mo, MD    Family History History reviewed. No pertinent family history.  Social History Social History  Substance Use Topics  . Smoking status: Never Smoker  . Smokeless tobacco: Never Used  . Alcohol use Yes     Allergies   Doxycycline   Review of Systems Review of Systems  Gastrointestinal: Positive for anal bleeding. Negative for abdominal pain, constipation, diarrhea, nausea, rectal pain and vomiting.  All other systems reviewed and are negative.    Physical Exam Updated Vital Signs BP 127/81 (BP Location: Right  Arm)   Pulse 61   Temp 98.5 F (36.9 C) (Oral)   Resp 16   SpO2 100%   Physical Exam  Constitutional: He is oriented to person, place, and time. He appears well-developed and well-nourished. No distress.  HENT:  Head: Normocephalic and atraumatic.  Cardiovascular: Normal rate, regular rhythm and normal heart sounds.   No murmur heard. Pulmonary/Chest: Effort normal and breath sounds normal. No respiratory distress.  Abdominal: Soft. He exhibits no distension. There is no  tenderness.  Genitourinary:  Genitourinary Comments: Chaperone present for exam. + internal hemorrhoid present. No gross blood noted on rectal exam. Light brown stool. Normal tone. No tenderness, mass or fissure.  Neurological: He is alert and oriented to person, place, and time.  Skin: Skin is warm and dry.  Nursing note and vitals reviewed.    ED Treatments / Results  Labs (all labs ordered are listed, but only abnormal results are displayed) Labs Reviewed  POC OCCULT BLOOD, ED - Abnormal; Notable for the following:       Result Value   Fecal Occult Bld POSITIVE (*)    All other components within normal limits  I-STAT CHEM 8, ED - Abnormal; Notable for the following:    Chloride 100 (*)    Creatinine, Ser 1.40 (*)    All other components within normal limits    EKG  EKG Interpretation None       Radiology No results found.  Procedures Procedures (including critical care time)  Medications Ordered in ED Medications - No data to display   Initial Impression / Assessment and Plan / ED Course  I have reviewed the triage vital signs and the nursing notes.  Pertinent labs & imaging results that were available during my care of the patient were reviewed by me and considered in my medical decision making (see chart for details).    Nicholas Newman is a 26 y.o. male who presents to ED for one episode of BRBPR this evening at 10:30 PM. No blood noted by him in stool, just bright red blood droplets in bowl and on toilet paper. On exam, patient is afebrile, hemodynamically stable with no gross blood noted on rectal exam. Light brown stool. No tenderness. Does have internal hemorrhoid present. Patient is in no pain. No abdominal, rectal, GU pain. No dysuria. Hemorrhoid symptomatic home care instructions discussed. Monitor sxs. Follow up strongly encouraged and included on discharge summary information. Reasons to return to ER were discussed and all questions answered.    Patient discussed with Dr. Christy Gentles who agrees with treatment plan.    Final Clinical Impressions(s) / ED Diagnoses   Final diagnoses:  BRBPR (bright red blood per rectum)  Hemorrhoids, unspecified hemorrhoid type    New Prescriptions Discharge Medication List as of 08/29/2016  5:54 AM       Awab Abebe, Ozella Almond, PA-C 08/29/16 2006    Ripley Fraise, MD 08/29/16 2338

## 2016-10-20 ENCOUNTER — Ambulatory Visit (INDEPENDENT_AMBULATORY_CARE_PROVIDER_SITE_OTHER): Payer: Managed Care, Other (non HMO) | Admitting: Family Medicine

## 2016-10-20 ENCOUNTER — Encounter: Payer: Self-pay | Admitting: Family Medicine

## 2016-10-20 VITALS — BP 100/70 | HR 70 | Temp 98.6°F | Ht 70.75 in | Wt 184.0 lb

## 2016-10-20 DIAGNOSIS — R109 Unspecified abdominal pain: Secondary | ICD-10-CM | POA: Diagnosis not present

## 2016-10-20 LAB — SEDIMENTATION RATE: Sed Rate: 3 mm/hr (ref 0–15)

## 2016-10-20 LAB — COMPREHENSIVE METABOLIC PANEL
ALK PHOS: 48 U/L (ref 39–117)
ALT: 17 U/L (ref 0–53)
AST: 31 U/L (ref 0–37)
Albumin: 4.4 g/dL (ref 3.5–5.2)
BILIRUBIN TOTAL: 0.4 mg/dL (ref 0.2–1.2)
BUN: 9 mg/dL (ref 6–23)
CALCIUM: 9.4 mg/dL (ref 8.4–10.5)
CO2: 34 meq/L — AB (ref 19–32)
Chloride: 103 mEq/L (ref 96–112)
Creatinine, Ser: 1.39 mg/dL (ref 0.40–1.50)
GFR: 79.94 mL/min (ref >60.00)
GLUCOSE: 102 mg/dL — AB (ref 70–99)
POTASSIUM: 3.6 meq/L (ref 3.5–5.1)
Sodium: 139 mEq/L (ref 135–145)
Total Protein: 7 g/dL (ref 6.0–8.3)

## 2016-10-20 LAB — CBC
HEMATOCRIT: 47.4 % (ref 39.0–52.0)
HEMOGLOBIN: 15.8 g/dL (ref 13.0–17.0)
MCHC: 33.3 g/dL (ref 30.0–36.0)
MCV: 97.3 fl (ref 78.0–100.0)
PLATELETS: 205 10*3/uL (ref 150.0–400.0)
RBC: 4.88 Mil/uL (ref 4.22–5.81)
RDW: 12.6 % (ref 11.5–15.5)
WBC: 5.9 10*3/uL (ref 4.0–10.5)

## 2016-10-20 MED ORDER — OMEPRAZOLE 20 MG PO CPDR: 20.0000 mg | DELAYED_RELEASE_CAPSULE | Freq: Two times a day (BID) | ORAL | 3 refills | Status: DC

## 2016-10-20 NOTE — Patient Instructions (Addendum)
Stay very well hydated.  Continue taking the probiotic.  Give [place] 2-3 business days to get the results of your labs back.   Let us know if you need anything.

## 2016-10-20 NOTE — Progress Notes (Signed)
Chief Complaint  Patient presents with  . Establish Care    abd discomfort-nausea,pain-mid chest-start with June 16 with noticed with blood in stool and using stool softers    Nicholas Newman is here for abdominal pain.  Duration: 2 months  He was seen in the emergency department for rectal bleeding and, after a negative workup, was told to take stool softeners for several days. After a couple days the bleeding resolved but he continued to take stool softeners. Once he stopped, his bowel movements normalized and his pain started to improve. He has been taking a probiotic that has been helpful. He also appears to have a separate epigastric pain that is achy, no radiation. It comes and goes, he notices it most of the morning and after eating. He will sometimes get a strange taste in his mouth. He denies bleeding, nighttime awakenings, unintentional weight loss, fevers, vomiting, or inability to keep anything down. He has not tried any reflux medication. Exercise and excitement also seemed to make the pain worse.  ROS: Constitutional: No fevers GI: No V/D/C, no bleeding + pain  Past Medical History:  Diagnosis Date  . Allergy    Foods   Family History  Problem Relation Age of Onset  . Hypertension Mother   . Heart disease Father   . Hypertension Brother   . Sarcoidosis Paternal Aunt    Past Surgical History:  Procedure Laterality Date  . NO PAST SURGERIES      BP 100/70 (BP Location: Left Arm, Patient Position: Sitting, Cuff Size: Large)   Pulse 70   Temp 98.6 F (37 C) (Oral)   Ht 5' 10.75" (1.797 m)   Wt 184 lb (83.5 kg)   SpO2 97%   BMI 25.84 kg/m  Gen.: Awake, alert, appears stated age 100: Mucous membranes moist without mucosal lesions Heart: Regular rate and rhythm without murmurs Lungs: Clear auscultation bilaterally, no rales or wheezing, normal effort without accessory muscle use. Abdomen: Bowel sounds are present. Abdomen is soft, TTP diffusely,  nondistended, no masses or organomegaly. Negative Murphy's, Rovsing's, McBurney's, and Carnett's sign. Psych: Age appropriate judgment and insight. Normal mood and affect.  Abdominal pain, unspecified abdominal location - Plan: CBC, Comprehensive metabolic panel, Sed Rate (ESR), omeprazole (PRILOSEC) 20 MG capsule  Orders as above.  Cont probiotic. Trial PPI-epigastric pain sounds like reflux, 2 mo trial. Symptom diary. Stay well hydrated. F/u in 4 weeks. Pt voiced understanding and agreement to the plan.  [place], DO 10/20/16 12:03 PM

## 2016-11-17 ENCOUNTER — Ambulatory Visit (INDEPENDENT_AMBULATORY_CARE_PROVIDER_SITE_OTHER): Payer: Managed Care, Other (non HMO) | Admitting: Family Medicine

## 2016-11-17 ENCOUNTER — Encounter: Payer: Self-pay | Admitting: Family Medicine

## 2016-11-17 VITALS — BP 122/84 | HR 63 | Temp 98.4°F | Ht 71.0 in | Wt 185.4 lb

## 2016-11-17 DIAGNOSIS — K219 Gastro-esophageal reflux disease without esophagitis: Secondary | ICD-10-CM | POA: Diagnosis not present

## 2016-11-17 NOTE — Progress Notes (Signed)
Chief Complaint  Patient presents with  . Follow-up    GERD Nicholas Newman is a 26 y.o. male who presents for evaluation of heartburn. I saw him for this around 1 mo ago and started him on Omeprazole.  He denies chest pain, cough, weight loss, bleeding Aggravating factors and specific triggers include peppermint and yogurt/bananas. Alleviating factors include proton pump inhibitors Significantly better, compliant with medicine, no side effects.  Past Medical History:  Diagnosis Date  . Allergy    Foods   Medications Current Outpatient Prescriptions on File Prior to Visit  Medication Sig Dispense Refill  . Multiple Vitamin (MULTIVITAMIN) tablet Take 1 tablet by mouth daily.    Marland Kitchen omeprazole (PRILOSEC) 20 MG capsule Take 1 capsule (20 mg total) by mouth 2 (two) times daily before a meal. 60 capsule 3  . OVER THE COUNTER MEDICATION Fish Oil 640mg -Take 2 capsule by mouth daily.     Allergies Allergies  Allergen Reactions  . Doxycycline     PEANUTS / SOY   Family History Family History  Problem Relation Age of Onset  . Hypertension Mother   . Heart disease Father   . Hypertension Brother   . Sarcoidosis Paternal Aunt     Review of Systems Gastrointestinal: as noted in HPI  Exam BP 122/84 (BP Location: Left Arm, Patient Position: Sitting, Cuff Size: Normal)   Pulse 63   Temp 98.4 F (36.9 C) (Oral)   Ht 5\' 11"  (1.803 m)   Wt 185 lb 6 oz (84.1 kg)   SpO2 96%   BMI 25.85 kg/m  General:  well developed, well nourished, in no apparent distress Skin:  warm, no pallor or diaphoresis Thorax:  nontender Lungs:  clear to auscultation, breath sounds equal bilaterally, no respiratory distress, no wheezes Cardio:  regular rate and rhythm without murmurs, heart sounds without clicks or rubs Abdomen:  abdomen soft, nontender; bowel sounds normal; no masses or organomegaly Psych: Well oriented with normal range of affect appropriate judgment/insight  Assessment and  Plan  Gastroesophageal reflux disease, esophagitis presence not specified  Doing well. Cont PPI for 1 more mo then stop. If still having issues, try H2 blocker, and if still having issues, will go back to PPI. Notify us via MyChart. F/u in around 1 mo for CPE or at earliest convenience. Pt voiced understanding and agreement to the plan.  [place], DO 11/17/16  11:49 AM

## 2016-11-17 NOTE — Patient Instructions (Addendum)
Keep playing around with your diet to see what flares your symptoms.  OK to keep taking probiotic if needed.  Finish the refill of medicine and then stop. Let me know how you are doing after going via MyChart. I will let you know the plan after that.  Let us know if you need anything.

## 2016-11-17 NOTE — Progress Notes (Signed)
Pre visit review using our clinic review tool, if applicable. No additional management support is needed unless otherwise documented below in the visit note. 

## 2017-01-03 ENCOUNTER — Ambulatory Visit (INDEPENDENT_AMBULATORY_CARE_PROVIDER_SITE_OTHER): Payer: Managed Care, Other (non HMO) | Admitting: Family Medicine

## 2017-01-03 ENCOUNTER — Encounter: Payer: Self-pay | Admitting: Family Medicine

## 2017-01-03 VITALS — BP 112/86 | HR 78 | Temp 98.2°F | Ht 71.0 in | Wt 185.5 lb

## 2017-01-03 DIAGNOSIS — Z23 Encounter for immunization: Secondary | ICD-10-CM | POA: Diagnosis not present

## 2017-01-03 DIAGNOSIS — R066 Hiccough: Secondary | ICD-10-CM | POA: Diagnosis not present

## 2017-01-03 MED ORDER — BACLOFEN 5 MG PO TABS: 5.0000 mg | ORAL_TABLET | Freq: Three times a day (TID) | ORAL | 0 refills | Status: DC | PRN

## 2017-01-03 NOTE — Progress Notes (Signed)
Pre visit review using our clinic review tool, if applicable. No additional management support is needed unless otherwise documented below in the visit note. 

## 2017-01-03 NOTE — Progress Notes (Signed)
Chief Complaint  Patient presents with  . Hiccups    Subjective: Patient is a 26 y.o. male here for hiccups.  After a workout yesterday evening around 8-9 PM, the patient stated having hiccups. They lasted throughout the night and into the afternoon. Right when he got to the office, they stopped. No recent procedures affecting the diaphragm, injury, cough, or upper respiratory infection.   ROS: Lungs: Denies SOB   Family History  Problem Relation Age of Onset  . Hypertension Mother   . Heart disease Father   . Hypertension Brother   . Sarcoidosis Paternal Aunt    Past Medical History:  Diagnosis Date  . Allergy    Foods   Allergies  Allergen Reactions  . Doxycycline     PEANUTS / SOY    Current Outpatient Prescriptions:  Marland Kitchen  Multiple Vitamin (MULTIVITAMIN) tablet, Take 1 tablet by mouth daily., Disp: , Rfl:  .  omeprazole (PRILOSEC) 20 MG capsule, Take 1 capsule (20 mg total) by mouth 2 (two) times daily before a meal., Disp: 60 capsule, Rfl: 3 .  OVER THE COUNTER MEDICATION, Fish Oil 640mg -Take 2 capsule by mouth daily., Disp: , Rfl:  .  Baclofen 5 MG TABS, Take 5 mg by mouth 3 (three) times daily as needed., Disp: 30 tablet, Rfl: 0  Objective: BP 112/86 (BP Location: Left Arm, Patient Position: Sitting, Cuff Size: Normal)   Pulse 78   Temp 98.2 F (36.8 C) (Oral)   Ht 5\' 11"  (1.803 m)   Wt 185 lb 8 oz (84.1 kg)   SpO2 93%   BMI 25.87 kg/m  General: Awake, appears stated age Heart: RRR, no murmurs Lungs: CTAB, no rales, wheezes or rhonchi. No accessory muscle use Psych: Age appropriate judgment and insight, normal affect and mood  Assessment and Plan: [place] - Plan: Baclofen 5 MG TABS  Orders as above. Likely related to deep breathing with exercise. Baclofen as needed. Recommended looking up techniques to help with pickups if they return and would like to try something other than the medicine. It does sound like this is resolved, however. Follow-up as  needed. The patient voiced understanding and agreement to the plan.  [place], DO 01/03/17  2:46 PM

## 2017-01-03 NOTE — Patient Instructions (Addendum)
The muscle relaxant will be at the pharmacy if you need it. You can try googling techniques to help with hiccups, but I am not convinced that there are data to support any particular technique.   Let us know if you need anything.

## 2017-01-03 NOTE — Addendum Note (Signed)
Addended by: Sharon Seller B on: 01/03/2017 02:49 PM   Modules accepted: Orders

## 2017-02-17 ENCOUNTER — Encounter: Payer: Self-pay | Admitting: Family Medicine

## 2017-02-18 ENCOUNTER — Other Ambulatory Visit: Payer: Self-pay | Admitting: Family Medicine

## 2017-02-18 ENCOUNTER — Encounter: Payer: Self-pay | Admitting: Medical

## 2017-02-18 ENCOUNTER — Telehealth: Payer: Self-pay | Admitting: *Deleted

## 2017-02-18 ENCOUNTER — Ambulatory Visit: Payer: Managed Care, Other (non HMO) | Admitting: Medical

## 2017-02-18 DIAGNOSIS — K219 Gastro-esophageal reflux disease without esophagitis: Secondary | ICD-10-CM | POA: Diagnosis not present

## 2017-02-18 DIAGNOSIS — R066 Hiccough: Secondary | ICD-10-CM | POA: Diagnosis not present

## 2017-02-18 MED ORDER — PANTOPRAZOLE SODIUM 40 MG PO TBEC: 40.0000 mg | DELAYED_RELEASE_TABLET | Freq: Every day | ORAL | 3 refills | Status: DC

## 2017-02-18 NOTE — Progress Notes (Signed)
Subjective:    Patient ID: Nicholas Newman, male    DOB: [birth year], 26 y.o.   MRN: [id number]  HPI  Pt has history of reflux. Pt states sometimes when it is severe he will reflux severely.   Pt pcp has diagnosed if with reflux. He has slight burning sensation often when he gets into repeat hiccup episodes. He had reflux and hiccup episodes since September.  He states when belches and burns will feel better.   Pt has been on omeprazole 20 mg since august/september.  Pt just got protonix called in by pcp.  Pt state hiccup episodes twice and last for about 2 days.  Usually about twice a month.  Pt has identified that beer, carbonated beverage, banana and yogurt as foods or beverages that exacerbate condition.    Review of Systems  Constitutional: Negative for chills, fatigue and fever.  Respiratory: Negative for cough, chest tightness, shortness of breath and wheezing.   Cardiovascular: Negative for chest pain and palpitations.  Gastrointestinal: Positive for abdominal pain. Negative for abdominal distention, blood in stool, constipation and diarrhea.       When severe hiccups will belch and taste acid.  Musculoskeletal: Negative for back pain, gait problem and neck pain.  Skin: Negative for rash.  Neurological: Negative for dizziness, seizures, light-headedness and headaches.  Hematological: Negative for adenopathy. Does not bruise/bleed easily.  Psychiatric/Behavioral: Negative for behavioral problems and confusion.    Past Medical History:  Diagnosis Date  . Allergy    Foods     Social History   Socioeconomic History  . Marital status: Single    Spouse name: Not on file  . Number of children: Not on file  . Years of education: Not on file  . Highest education level: Not on file  Social Needs  . Financial resource strain: Not on file  . Food insecurity - worry: Not on file  . Food insecurity - inability: Not on file  . Transportation needs - medical: Not  on file  . Transportation needs - non-medical: Not on file  Occupational History  . Not on file  Tobacco Use  . Smoking status: Never Smoker  . Smokeless tobacco: Never Used  Substance and Sexual Activity  . Alcohol use: Yes  . Drug use: No  . Sexual activity: Not on file  Other Topics Concern  . Not on file  Social History Narrative  . Not on file    Past Surgical History:  Procedure Laterality Date  . NO PAST SURGERIES      Family History  Problem Relation Age of Onset  . Hypertension Mother   . Heart disease Father   . Hypertension Brother   . Sarcoidosis Paternal Aunt     Allergies  Allergen Reactions  . Doxycycline     PEANUTS / SOY    Current Outpatient Medications on File Prior to Visit  Medication Sig Dispense Refill  . Baclofen 5 MG TABS Take 5 mg by mouth 3 (three) times daily as needed. 30 tablet 0  . Multiple Vitamin (MULTIVITAMIN) tablet Take 1 tablet by mouth daily.    Marland Kitchen OVER THE COUNTER MEDICATION Fish Oil 640mg -Take 2 capsule by mouth daily.    . pantoprazole (PROTONIX) 40 MG tablet Take 1 tablet (40 mg total) by mouth daily. 30 tablet 3   No current facility-administered medications on file prior to visit.     There were no vitals taken for this visit.      Objective:  Physical Exam  General Appearance- Not in acute distress.  HEENT Eyes- Scleraeral/Conjuntiva-bilat- Not Yellow. Mouth & Throat- Normal except in the right side bucca mucosa he has a 4 mm slight bruised area.  He points this out to me today.  Chest and Lung Exam Auscultation: Breath sounds:-Normal. Adventitious sounds:- No Adventitious sounds.  Cardiovascular Auscultation:Rythm - Regular. Heart Sounds -Normal heart sounds.  Abdomen Inspection:-Inspection Normal.  Palpation/Perucssion: Palpation and Percussion of the abdomen reveal- Non Tender, No Rebound tenderness, No rigidity(Guarding) and No Palpable abdominal masses.  Liver:-Normal.  Spleen:- Normal.   Back-  no cva tenderness.      Assessment & Plan:  For your history of reflux with occasional severe hiccups, I do think Protonix is a better medication than Prilosec.  I think you will notice some improvement.  Continue to avoid foods or beverages that cause exacerbation of your symptoms.  I do think it would be a good idea to get a H. pylori breath test today.   If your symptoms persist despite Protonix then you might need referral to gastroenterologist for EGD.  Follow-up in 2 weeks or as needed.  Note also regarding the patient's small bruised area in the right side buccal mucosa, I advised him we would follow this very closely.  Think it is a small bruise possibly from chewing/biting on the mucosa area.  Think area should heal completely in 5-7 days.  If the area were to persist definitely let us know and if he gets other areas then we need to recheck and possibly do workup at that time.  Patient expresses understanding and is in great with conservative plan.   Demeka Sutter, Percell Miller, PA-C

## 2017-02-18 NOTE — Progress Notes (Signed)
PPI changed out.

## 2017-02-18 NOTE — Patient Instructions (Signed)
For your history of reflux with occasional severe hiccups, I do think Protonix is a better medication than Prilosec.  I think you will notice some improvement.  Continue to avoid foods or beverages that cause exacerbation of your symptoms.  I do think it would be a good idea to get a H. pylori breath test today.   If your symptoms persist despite Protonix then you might need referral to gastroenterologist for EGD.  Follow-up in 2 weeks or as needed.

## 2017-02-18 NOTE — Telephone Encounter (Signed)
Patient called and stated that he has had a bad case of hiccups for 48 hours straight, nonstop and it is exhausting.  He has been taking the medication as prescribe by Dr. Nani Ravens and it is not helping.  He does not want to start the new medication because he would like something done.  He scheduled an appt for 230 with Edward.

## 2017-02-20 LAB — H. PYLORI BREATH TEST: H. pylori Breath Test: NOT DETECTED

## 2018-08-22 ENCOUNTER — Encounter: Payer: Self-pay | Admitting: Family Medicine

## 2018-08-22 ENCOUNTER — Other Ambulatory Visit: Payer: Self-pay

## 2018-08-22 ENCOUNTER — Telehealth: Payer: Self-pay

## 2018-08-22 ENCOUNTER — Ambulatory Visit (INDEPENDENT_AMBULATORY_CARE_PROVIDER_SITE_OTHER): Payer: 59 | Admitting: Family Medicine

## 2018-08-22 DIAGNOSIS — L989 Disorder of the skin and subcutaneous tissue, unspecified: Secondary | ICD-10-CM | POA: Diagnosis not present

## 2018-08-22 NOTE — Telephone Encounter (Signed)
Copied from [place] [phone] [phone]. Topic: Appointment Scheduling - Scheduling Inquiry for Clinic >> Aug 21, 2018  1:45 PM Celene Kras A wrote: Reason for CRM: Pts mother called stating pt is experiencing a very large cyst in groin area making it very hard for pt to move. Please advise. >> Aug 21, 2018  4:21 PM Berneta Levins wrote: Mom calling again to find out when pt can get an appointment.

## 2018-08-22 NOTE — Telephone Encounter (Signed)
SCHEDULED Virtual Visit today 08/22/2018 at 10:30

## 2018-08-22 NOTE — Progress Notes (Signed)
Chief Complaint  Patient presents with  . Cyst    Nicholas Newman is a 28 y.o. male here for a skin complaint. Due to COVID-19 pandemic, we are interacting via web portal for an electronic face-to-face visit. I verified patient's ID using 2 identifiers. Patient agreed to proceed with visit via this method. Patient is at home, I am at office. Patient and I are present for visit.   Duration: 4 days Location: R groin Pruritic? No Painful? Yes Drainage? No New soaps/lotions/topicals/detergents? No  Has been trimming in area more. Sick contacts? No Other associated symptoms: Feels it may be bigger Therapies tried thus far: Azithromycin and Rocephin at UC; urine testing done, awaiting results Denies GU complaints.   ROS:  Const: No fevers Skin: As noted in HPI  Past Medical History:  Diagnosis Date  . Allergy    Foods   Exam No conversational dyspnea Age appropriate judgment and insight Nml affect and mood Skin- there does appear to be an elliptically shaped elevation of skin, flesh colored without a head or erythema over R inguinal region  Skin lesion  May be swollen LN vs inguinal hernia vs cyst? I want to see him in office tomorrow.  The patient voiced understanding and agreement to the plan.  [place], DO 08/22/18 10:46 AM

## 2018-08-23 ENCOUNTER — Ambulatory Visit (HOSPITAL_BASED_OUTPATIENT_CLINIC_OR_DEPARTMENT_OTHER)
Admission: RE | Admit: 2018-08-23 | Discharge: 2018-08-23 | Disposition: A | Discharge status: Home / Self Care | Payer: 59 | Source: Ambulatory Visit | Attending: Family Medicine | Admitting: Family Medicine

## 2018-08-23 ENCOUNTER — Other Ambulatory Visit: Payer: Self-pay

## 2018-08-23 ENCOUNTER — Encounter: Payer: Self-pay | Admitting: Family Medicine

## 2018-08-23 ENCOUNTER — Ambulatory Visit: Payer: 59 | Admitting: Family Medicine

## 2018-08-23 VITALS — BP 118/78 | HR 70 | Temp 98.8°F | Ht 70.0 in | Wt 166.2 lb

## 2018-08-23 DIAGNOSIS — L989 Disorder of the skin and subcutaneous tissue, unspecified: Secondary | ICD-10-CM | POA: Diagnosis present

## 2018-08-23 MED ORDER — MELOXICAM 15 MG PO TABS: 15.0000 mg | ORAL_TABLET | Freq: Every day | ORAL | 0 refills | Status: DC

## 2018-08-23 NOTE — Progress Notes (Signed)
F/u in person visit for skin lesion in groin. Getting harder, large and more painful.   Exam Enlarged lesion in R inguinal region, it is either within or deep to the inguinal canal. There is no erythema or excessive warmth.  Skin lesion - Plan: US Abdomen Limited  Ck [place]. If neg, will refer to derm. If suggestive of hernia, will refer to Gen surg. Urgent. Mobic for pain. Can also use Tylenol and ice.  F/u prn. He voiced understanding and agreement to the plan.  Crosby Oyster Wendling 12:54 PM 08/23/18

## 2018-08-24 ENCOUNTER — Encounter: Payer: Self-pay | Admitting: Family Medicine

## 2018-08-24 ENCOUNTER — Other Ambulatory Visit: Payer: Self-pay | Admitting: Family Medicine

## 2018-08-24 MED ORDER — DOXYCYCLINE HYCLATE 100 MG PO CAPS: 100.0000 mg | ORAL_CAPSULE | Freq: Two times a day (BID) | ORAL | 0 refills | Status: DC

## 2018-08-25 ENCOUNTER — Encounter: Payer: Self-pay | Admitting: Family Medicine

## 2018-08-28 ENCOUNTER — Encounter: Payer: Self-pay | Admitting: Family Medicine

## 2018-08-30 ENCOUNTER — Telehealth: Payer: Self-pay | Admitting: Family Medicine

## 2018-08-30 NOTE — Telephone Encounter (Signed)
Briefly spoke w pt regarding his pain. Feeling much better, but still difficult to work. Would like to see me Monday in person to evaluate him. Please schedule in person on Mon. Ty.

## 2018-08-31 NOTE — Telephone Encounter (Signed)
Called scheduled appt for Monday 09/04/2018 at 1:45---needs to pickup completed paperwork that day.

## 2018-09-04 ENCOUNTER — Encounter: Payer: Self-pay | Admitting: Family Medicine

## 2018-09-04 ENCOUNTER — Ambulatory Visit: Payer: 59 | Admitting: Family Medicine

## 2018-09-04 ENCOUNTER — Other Ambulatory Visit: Payer: Self-pay

## 2018-09-04 VITALS — BP 124/82 | HR 96 | Temp 99.7°F | Ht 70.0 in | Wt 168.5 lb

## 2018-09-04 DIAGNOSIS — I889 Nonspecific lymphadenitis, unspecified: Secondary | ICD-10-CM

## 2018-09-04 NOTE — Patient Instructions (Signed)
Ice/cold pack over area for 10-15 min twice daily.  OK to take Tylenol 1000 mg (2 extra strength tabs) or 975 mg (3 regular strength tabs) every 6 hours as needed.  Let us know if you need anything.  

## 2018-09-04 NOTE — Progress Notes (Signed)
CC: f/u  Subjective: Patient is a 28 y.o. male here for f/u.  Has been tx'd for a suspected infected lymph node in R inguinal region with doxy, 3 weeks total. Reports he continues to improve. Ready to go back to work, not full duty just yet. Would like a few week with restricted duty due to continue pain. Worse when staying in same position for periods of time. Feels the lump is getting smaller, less warm and tender. No fevers or GU issues. Has bee compliant w doxy, no AE's. Taking mobic for relief also.    ROS: Const: No fevers  Past Medical History:  Diagnosis Date  . Allergy    Foods    Objective: BP 124/82 (BP Location: Left Arm, Patient Position: Sitting, Cuff Size: Normal)   Pulse 96   Temp 99.7 F (37.6 C) (Oral)   Ht 5\' 10"  (1.778 m)   Wt 168 lb 8 oz (76.4 kg)   SpO2 98%   BMI 24.18 kg/m  General: Awake, appears stated age Skin: Elliptically shaped lesion in R inguinal canal small than prior, less ttp, no excessive warmth, no erythema or fluctuance Lungs: No accessory muscle use Psych: Age appropriate judgment and insight, normal affect and mood  Assessment and Plan: Lymphadenitis - Plan: cont doxy and Mobic prn. Ice. Tylenol. Forms for work filled out. Return Th 6/25 w restrictions, return full duty after 2 weeks.  The patient voiced understanding and agreement to the plan.  [place], DO 09/04/18  2:53 PM

## 2018-09-11 ENCOUNTER — Telehealth: Payer: Self-pay | Admitting: Family Medicine

## 2018-09-11 NOTE — Telephone Encounter (Signed)
Pt dropped off paperwork for Dr. Nani Ravens to complete so that he may return to work.  I was able to speak with Dr. Nani Ravens in the hallway bten pts today to verify that he can complete the pw without bringing the pt back in to be seen.  Pt wants to return to work on 09/17/18.  Please complete only Page 4 & the very last page of the document.  Please fax entire document per pt to [phone].  Document placed in Dr. Irene Limbo box for pick up and completion.

## 2018-09-18 ENCOUNTER — Encounter: Payer: Self-pay | Admitting: Family Medicine

## 2018-09-18 NOTE — Telephone Encounter (Signed)
Patient checking on the status of Dr. Radene Ou. Please follow up with patient regarding status best # [phone]

## 2018-09-18 NOTE — Telephone Encounter (Signed)
Form is almost complete and needs to be faxed.

## 2018-09-19 ENCOUNTER — Encounter: Payer: Self-pay | Admitting: Family Medicine

## 2018-09-19 NOTE — Telephone Encounter (Signed)
Form was faxed///orginal at the front for pickup///called left message with pt to pickup orginal

## 2019-02-14 ENCOUNTER — Ambulatory Visit (INDEPENDENT_AMBULATORY_CARE_PROVIDER_SITE_OTHER): Payer: Managed Care, Other (non HMO) | Admitting: Family Medicine

## 2019-02-14 ENCOUNTER — Other Ambulatory Visit: Payer: Self-pay

## 2019-02-14 ENCOUNTER — Encounter: Payer: Self-pay | Admitting: Family Medicine

## 2019-02-14 DIAGNOSIS — I889 Nonspecific lymphadenitis, unspecified: Secondary | ICD-10-CM | POA: Diagnosis not present

## 2019-02-14 MED ORDER — SULFAMETHOXAZOLE-TRIMETHOPRIM 800-160 MG PO TABS
1.0000 | ORAL_TABLET | Freq: Two times a day (BID) | ORAL | 0 refills | Status: DC
Start: 2019-02-14 — End: 2019-02-23

## 2019-02-14 NOTE — Progress Notes (Signed)
Chief Complaint  Patient presents with  . medication problem (antibiotic)    Subjective: Patient is a 28 y.o. male here for f/u enlarged LN in R groin. Due to COVID-19 pandemic, we are interacting via web portal for an electronic face-to-face visit. I verified patient's ID using 2 identifiers. Patient agreed to proceed with visit via this method. Patient is at work, I am at office. Patient and I are present for visit.   Pt continues to have issues with R inguinal LN. Was rx'd 3 weeks of doxy for possible infection. He was able to take only 10 d 2/2 AE's. No fevers. Feels like it got a little better. No redness, drainage, urinary complaints, testicular pain, fevers, night sweats. Worse after he worked out. Does not feel bulges on bearing down.   ROS: Const: As noted in HPI  Past Medical History:  Diagnosis Date  . Allergy    Foods    Objective: No conversational dyspnea Age appropriate judgment and insight Nml affect and mood  Assessment and Plan: Lymphadenitis - Plan: sulfamethoxazole-trimethoprim (BACTRIM DS) 800-160 MG tablet  Bactrim in place of doxy. If no improvement, will let us know and I will eval in office. Ice. Tylenol.  F/u prn.  The patient voiced understanding and agreement to the plan.  [place], DO 02/14/19  2:22 PM

## 2019-02-21 ENCOUNTER — Encounter: Payer: Self-pay | Admitting: Family Medicine

## 2019-02-22 ENCOUNTER — Other Ambulatory Visit: Payer: Self-pay | Admitting: Family Medicine

## 2019-02-22 DIAGNOSIS — R599 Enlarged lymph nodes, unspecified: Secondary | ICD-10-CM

## 2019-02-23 ENCOUNTER — Other Ambulatory Visit: Payer: Self-pay | Admitting: Family Medicine

## 2019-02-23 MED ORDER — MELOXICAM 15 MG PO TABS: 15.0000 mg | ORAL_TABLET | Freq: Every day | ORAL | 0 refills | Status: DC

## 2019-02-26 ENCOUNTER — Other Ambulatory Visit: Payer: Self-pay

## 2019-02-26 ENCOUNTER — Ambulatory Visit (HOSPITAL_BASED_OUTPATIENT_CLINIC_OR_DEPARTMENT_OTHER)
Admission: RE | Admit: 2019-02-26 | Discharge: 2019-02-26 | Disposition: A | Discharge status: Home / Self Care | Payer: Managed Care, Other (non HMO) | Source: Ambulatory Visit | Attending: Family Medicine | Admitting: Family Medicine

## 2019-02-26 ENCOUNTER — Encounter (HOSPITAL_BASED_OUTPATIENT_CLINIC_OR_DEPARTMENT_OTHER): Payer: Self-pay

## 2019-02-26 DIAGNOSIS — R599 Enlarged lymph nodes, unspecified: Secondary | ICD-10-CM | POA: Diagnosis present

## 2019-02-26 MED ORDER — IOHEXOL 300 MG/ML  SOLN
100.0000 mL | Freq: Once | INTRAMUSCULAR | Status: AC | PRN
  Administered 2019-02-26: 100 mL via INTRAVENOUS

## 2019-02-27 ENCOUNTER — Other Ambulatory Visit: Payer: Self-pay | Admitting: Family Medicine

## 2019-02-27 MED ORDER — AMOXICILLIN-POT CLAVULANATE 875-125 MG PO TABS
1.0000 | ORAL_TABLET | Freq: Two times a day (BID) | ORAL | 0 refills | Status: DC
Start: 2019-02-27 — End: 2019-12-14

## 2019-04-19 ENCOUNTER — Encounter: Payer: Self-pay | Admitting: Family Medicine

## 2019-07-20 ENCOUNTER — Encounter: Payer: Self-pay | Admitting: Gastroenterology

## 2019-08-09 ENCOUNTER — Ambulatory Visit: Payer: Managed Care, Other (non HMO) | Admitting: Gastroenterology

## 2019-12-06 ENCOUNTER — Telehealth: Payer: Self-pay | Admitting: Family Medicine

## 2019-12-06 NOTE — Telephone Encounter (Signed)
Pt had FMLA paperwork up at front since 09/2018 and never picked up.   I mailed back the OG copy to home address

## 2019-12-12 ENCOUNTER — Encounter: Payer: Self-pay | Admitting: Internal Medicine

## 2019-12-14 ENCOUNTER — Other Ambulatory Visit (INDEPENDENT_AMBULATORY_CARE_PROVIDER_SITE_OTHER): Payer: 59

## 2019-12-14 ENCOUNTER — Ambulatory Visit (INDEPENDENT_AMBULATORY_CARE_PROVIDER_SITE_OTHER): Payer: 59 | Admitting: Internal Medicine

## 2019-12-14 ENCOUNTER — Encounter: Payer: Self-pay | Admitting: Internal Medicine

## 2019-12-14 VITALS — BP 108/62 | HR 75 | Ht 70.75 in | Wt 161.4 lb

## 2019-12-14 DIAGNOSIS — R197 Diarrhea, unspecified: Secondary | ICD-10-CM | POA: Diagnosis not present

## 2019-12-14 DIAGNOSIS — R634 Abnormal weight loss: Secondary | ICD-10-CM | POA: Diagnosis not present

## 2019-12-14 DIAGNOSIS — Z800 Family history of malignant neoplasm of digestive organs: Secondary | ICD-10-CM

## 2019-12-14 DIAGNOSIS — K921 Melena: Secondary | ICD-10-CM

## 2019-12-14 LAB — CBC WITH DIFFERENTIAL/PLATELET
Basophils Absolute: 0 10*3/uL (ref 0.0–0.1)
Basophils Relative: 0.5 % (ref 0.0–3.0)
Eosinophils Absolute: 0.1 10*3/uL (ref 0.0–0.7)
Eosinophils Relative: 1.4 % (ref 0.0–5.0)
HCT: 43.6 % (ref 39.0–52.0)
Hemoglobin: 14.8 g/dL (ref 13.0–17.0)
Lymphocytes Relative: 25.6 % (ref 12.0–46.0)
Lymphs Abs: 1.5 10*3/uL (ref 0.7–4.0)
MCHC: 34 g/dL (ref 30.0–36.0)
MCV: 94.5 fl (ref 78.0–100.0)
Monocytes Absolute: 0.7 10*3/uL (ref 0.1–1.0)
Monocytes Relative: 12.1 % — ABNORMAL HIGH (ref 3.0–12.0)
Neutro Abs: 3.4 10*3/uL (ref 1.4–7.7)
Neutrophils Relative %: 60.4 % (ref 43.0–77.0)
Platelets: 220 10*3/uL (ref 150.0–400.0)
RBC: 4.61 Mil/uL (ref 4.22–5.81)
RDW: 12.3 % (ref 11.5–15.5)
WBC: 5.7 10*3/uL (ref 4.0–10.5)

## 2019-12-14 LAB — COMPREHENSIVE METABOLIC PANEL
ALT: 8 U/L (ref 0–53)
AST: 23 U/L (ref 0–37)
Albumin: 4.7 g/dL (ref 3.5–5.2)
Alkaline Phosphatase: 43 U/L (ref 39–117)
BUN: 17 mg/dL (ref 6–23)
CO2: 31 mEq/L (ref 19–32)
Calcium: 9.8 mg/dL (ref 8.4–10.5)
Chloride: 103 mEq/L (ref 96–112)
Creatinine, Ser: 1.22 mg/dL (ref 0.40–1.50)
GFR: 85.37 mL/min (ref >60.00)
Glucose, Bld: 88 mg/dL (ref 70–99)
Potassium: 4.1 mEq/L (ref 3.5–5.1)
Sodium: 139 mEq/L (ref 135–145)
Total Bilirubin: 0.5 mg/dL (ref 0.2–1.2)
Total Protein: 7.3 g/dL (ref 6.0–8.3)

## 2019-12-14 LAB — IGA: IgA: 84 mg/dL (ref 68–378)

## 2019-12-14 MED ORDER — CLENPIQ 10-3.5-12 MG-GM -GM/160ML PO SOLN: 1.0000 | ORAL | 0 refills | Status: DC

## 2019-12-14 NOTE — Progress Notes (Signed)
Simpson Paulos Balicki 29 y.o. 08-03-1990 [id number]  Assessment & Plan:   Encounter Diagnoses  Name Primary?  . Loss of weight Yes  . Diarrhea, unspecified type   . Hematochezia   . Family history of colon cancer in father    Multiple issues here mostly sound lower GI tract but I think given the constellation of problems we will plan to work-up with an EGD and colonoscopy.  Dr. Bryan Lemma will perform these as I am unable to do so right now due to injuries.  This could be severe IBS with anorectal bleeding, it could be inflammatory bowel disease, he is at high risk of colorectal cancer given the family history.  Celiac disease is unlikely but he wonders about this so I will do the testing as it is in the differential.  The risks and benefits as well as alternatives of endoscopic procedure(s) have been discussed and reviewed. All questions answered. The patient agrees to proceed.  Laboratory evaluation as below Orders Placed This Encounter  Procedures  . Comprehensive metabolic panel  . CBC with Differential/Platelet  . Tissue transglutaminase, IgA  . IgA  . C-reactive protein  . Ambulatory referral to Gastroenterology    Further plans pending this evaluation.  I appreciate the opportunity to care for this patient. CC: Shelda Pal, DO Dr. Gerrit Heck  Subjective:   Chief Complaint: Multiple complaints mainly weight loss diarrhea and bloody stools  HPI This is a 29 year old African-American man who reports for the last year or perhaps more he has had trouble with frequent watery diarrhea three times a day often urgent.  There is fairly frequent rectal bleeding or hematochezia.  He shows me a picture of bright red blood in the bowl with stool.  He believes he has lost 30 pounds in the past year though our notes do not support that.  Appetite is off at times.  He works nights at [employer] and says this interferes with ability to eat at times.  He describes  upper abdominal pain and tenderness at the xiphoid.  Sometimes when he works out or [employer] with a Pensions consultant at work he will have more pain.  Some occasional heartburn and lots of generalized bloating as well.  Previous work-up has included CT scan of the abdomen and pelvis with contrast which was done because of inguinal adenopathy but was negative with respect to the GI tract.  Has not had any labs since [age].  At that time sed rate was normal and CBC and comprehensive metabolic panel were unremarkable as well.  His father is 52 and was diagnosed with stage IV colon cancer last year.  ER visit 2018 for rectal bleeding and an internal hemorrhoid was found on rectal exam at that time.  Denies dysphagia nausea vomiting.  Wt Readings from Last 3 Encounters:  12/14/19 161 lb 6.4 oz (73.2 kg)  09/04/18 168 lb 8 oz (76.4 kg)  08/23/18 166 lb 4 oz (75.4 kg)   August 2018 184 pounds Allergies  Allergen Reactions  . Doxycycline Other (See Comments)    GI issues   Current Meds  Medication Sig  . Multiple Vitamin (MULTIVITAMIN) tablet Take 1 tablet by mouth daily.  . NON FORMULARY Goli Ashwajandha 2 gummies QD   Past Medical History:  Diagnosis Date  . Allergy    Foods  . GERD (gastroesophageal reflux disease)   . Singultus    Past Surgical History:  Procedure Laterality Date  . NO PAST  SURGERIES     Social History   Social History Narrative   Single, night shift Freight forwarder at [employer]   Never smoker, rare alcohol, does smoke marijuana no  tobacco   family history includes Colon cancer in his father; Heart disease in his father; Hypertension in his brother and mother; Sarcoidosis in his paternal aunt.   Review of Systems As per HPI some joint pains in the knees and allergy problems.  All other review of systems are negative.  Objective:   Physical Exam @BP  108/62   Pulse 75   Ht 5' 10.75" (1.797 m)   Wt 161 lb 6.4 oz (73.2 kg)   SpO2 99%   BMI 22.67 kg/m  @  General:  Well-developed, well-nourished and in no acute distress Eyes:  anicteric. Lungs: Clear to auscultation bilaterally. Heart:  S1S2, no rubs, murmurs, gallops. Abdomen:  soft, mildly tender upper, no hepatosplenomegaly, hernia, or mass and BS+.  No mm tenderness with abdominal wall tension Rectal: Is deferred until colonoscopy  Extremities:   no edema, cyanosis or clubbing Skin   no rash. Neuro:  A&O x 3.  Psych:  appropriate mood and  Affect.   Data Reviewed: See HPI

## 2019-12-14 NOTE — Patient Instructions (Signed)
Your provider has requested that you go to the basement level for lab work before leaving today. Press "B" on the elevator. The lab is located at the first door on the left as you exit the elevator.   You have been scheduled for an endoscopy and colonoscopy. Please follow the written instructions given to you at your visit today. Please pick up your prep supplies at the pharmacy within the next 1-3 days. If you use inhalers (even only as needed), please bring them with you on the day of your procedure.   I appreciate the opportunity to care for you. Silvano Rusk, MD, [facility]

## 2019-12-17 LAB — TISSUE TRANSGLUTAMINASE, IGA: (tTG) Ab, IgA: <1 U/mL

## 2019-12-18 ENCOUNTER — Other Ambulatory Visit: Payer: Self-pay | Admitting: Gastroenterology

## 2019-12-18 LAB — SARS CORONAVIRUS 2 (TAT 6-24 HRS): SARS Coronavirus 2: NEGATIVE

## 2019-12-20 ENCOUNTER — Ambulatory Visit (AMBULATORY_SURGERY_CENTER): Payer: 59 | Admitting: Gastroenterology

## 2019-12-20 ENCOUNTER — Other Ambulatory Visit: Payer: Self-pay

## 2019-12-20 ENCOUNTER — Encounter: Payer: Self-pay | Admitting: Gastroenterology

## 2019-12-20 VITALS — BP 115/75 | HR 66 | Temp 98.7°F | Resp 14 | Ht 70.75 in | Wt 161.0 lb

## 2019-12-20 DIAGNOSIS — K5730 Diverticulosis of large intestine without perforation or abscess without bleeding: Secondary | ICD-10-CM

## 2019-12-20 DIAGNOSIS — R197 Diarrhea, unspecified: Secondary | ICD-10-CM | POA: Diagnosis not present

## 2019-12-20 DIAGNOSIS — D128 Benign neoplasm of rectum: Secondary | ICD-10-CM

## 2019-12-20 DIAGNOSIS — R140 Abdominal distension (gaseous): Secondary | ICD-10-CM | POA: Diagnosis not present

## 2019-12-20 DIAGNOSIS — K641 Second degree hemorrhoids: Secondary | ICD-10-CM

## 2019-12-20 DIAGNOSIS — K921 Melena: Secondary | ICD-10-CM

## 2019-12-20 DIAGNOSIS — R1013 Epigastric pain: Secondary | ICD-10-CM

## 2019-12-20 DIAGNOSIS — R634 Abnormal weight loss: Secondary | ICD-10-CM

## 2019-12-20 DIAGNOSIS — K621 Rectal polyp: Secondary | ICD-10-CM

## 2019-12-20 DIAGNOSIS — K633 Ulcer of intestine: Secondary | ICD-10-CM | POA: Diagnosis not present

## 2019-12-20 DIAGNOSIS — Z800 Family history of malignant neoplasm of digestive organs: Secondary | ICD-10-CM

## 2019-12-20 MED ORDER — SODIUM CHLORIDE 0.9 % IV SOLN: 500.0000 mL | Freq: Once | INTRAVENOUS | Status: DC

## 2019-12-20 NOTE — Op Note (Signed)
[facility] Patient Name: Nicholas Newman Procedure Date: 12/20/2019 1:22 PM MRN: [id number] Endoscopist: Gerrit Heck , MD Age: 29 Referring MD:  Date of Birth: 06/23/1990 Gender: Male Account #: [id number] Procedure:                Upper GI endoscopy Indications:              Epigastric abdominal pain, Hematochezia, Abdominal                            bloating, Diarrhea, Weight loss Medicines:                Monitored Anesthesia Care Procedure:                Pre-Anesthesia Assessment:                           - Prior to the procedure, a History and Physical                            was performed, and patient medications and                            allergies were reviewed. The patient's tolerance of                            previous anesthesia was also reviewed. The risks                            and benefits of the procedure and the sedation                            options and risks were discussed with the patient.                            All questions were answered, and informed consent                            was obtained. Prior Anticoagulants: The patient has                            taken no previous anticoagulant or antiplatelet                            agents. ASA Grade Assessment: II - A patient with                            mild systemic disease. After reviewing the risks                            and benefits, the patient was deemed in                            satisfactory condition to undergo the procedure.  After obtaining informed consent, the endoscope was                            passed under direct vision. Throughout the                            procedure, the patient's blood pressure, pulse, and                            oxygen saturations were monitored continuously. The                            Endoscope was introduced through the mouth, and                            advanced to the  second part of duodenum. The upper                            GI endoscopy was accomplished without difficulty.                            The patient tolerated the procedure well. Scope In: Scope Out: Findings:                 The examined esophagus was normal.                           The gastroesophageal flap valve was visualized                            endoscopically and classified as Hill Grade II                            (fold present, opens with respiration).                           The entire examined stomach was normal. Biopsies                            were taken with a cold forceps for Helicobacter                            pylori testing. Estimated blood loss was minimal.                           The duodenal bulb, first portion of the duodenum                            and second portion of the duodenum were normal.                            Biopsies for histology were taken with a cold  forceps for evaluation of celiac disease. Estimated                            blood loss was minimal. Complications:            No immediate complications. Estimated Blood Loss:     Estimated blood loss was minimal. Impression:               - Normal esophagus.                           - Gastroesophageal flap valve classified as Hill                            Grade II (fold present, opens with respiration).                           - Normal stomach. Biopsied.                           - Normal duodenal bulb, first portion of the                            duodenum and second portion of the duodenum.                            Biopsied. Recommendation:           - Patient has a contact number available for                            emergencies. The signs and symptoms of potential                            delayed complications were discussed with the                            patient. Return to normal activities tomorrow.                             Written discharge instructions were provided to the                            patient.                           - Resume previous diet.                           - Continue present medications.                           - Await pathology results.                           - Perform a colonoscopy today. Gerrit Heck, MD 12/20/2019 2:17:14 PM

## 2019-12-20 NOTE — Patient Instructions (Signed)
Handouts given for polyps, hemorrhoids and diverticulosis.  YOU HAD AN ENDOSCOPIC PROCEDURE TODAY AT [facility]:   Refer to the procedure report that was given to you for any specific questions about what was found during the examination.  If the procedure report does not answer your questions, please call your gastroenterologist to clarify.  If you requested that your care partner not be given the details of your procedure findings, then the procedure report has been included in a sealed envelope for you to review at your convenience later.  YOU SHOULD EXPECT: Some feelings of bloating in the abdomen. Passage of more gas than usual.  Walking can help get rid of the air that was put into your GI tract during the procedure and reduce the bloating. If you had a lower endoscopy (such as a colonoscopy or flexible sigmoidoscopy) you may notice spotting of blood in your stool or on the toilet paper. If you underwent a bowel prep for your procedure, you may not have a normal bowel movement for a few days.  Please Note:  You might notice some irritation and congestion in your nose or some drainage.  This is from the oxygen used during your procedure.  There is no need for concern and it should clear up in a day or so.  SYMPTOMS TO REPORT IMMEDIATELY:   Following lower endoscopy (colonoscopy or flexible sigmoidoscopy):  Excessive amounts of blood in the stool  Significant tenderness or worsening of abdominal pains  Swelling of the abdomen that is new, acute  Fever of 100F or higher   Following upper endoscopy (EGD)  Vomiting of blood or coffee ground material  New chest pain or pain under the shoulder blades  Painful or persistently difficult swallowing  New shortness of breath  Fever of 100F or higher  Black, tarry-looking stools  For urgent or emergent issues, a gastroenterologist can be reached at any hour by calling [phone]. Do not use MyChart messaging for urgent  concerns.    DIET:  We do recommend a small meal at first, but then you may proceed to your regular diet.  Drink plenty of fluids but you should avoid alcoholic beverages for 24 hours.  ACTIVITY:  You should plan to take it easy for the rest of today and you should NOT DRIVE or use heavy machinery until tomorrow (because of the sedation medicines used during the test).    FOLLOW UP: Our staff will call the number listed on your records 48-72 hours following your procedure to check on you and address any questions or concerns that you may have regarding the information given to you following your procedure. If we do not reach you, we will leave a message.  We will attempt to reach you two times.  During this call, we will ask if you have developed any symptoms of COVID 19. If you develop any symptoms (ie: fever, flu-like symptoms, shortness of breath, cough etc.) before then, please call [phone].  If you test positive for Covid 19 in the 2 weeks post procedure, please call and report this information to [place].    If any biopsies were taken you will be contacted by phone or by letter within the next 1-3 weeks.  Please call us at [phone] if you have not heard about the biopsies in 3 weeks.    SIGNATURES/CONFIDENTIALITY: You and/or your care partner have signed paperwork which will be entered into your electronic medical record.  These signatures attest to  the fact that that the information above on your After Visit Summary has been reviewed and is understood.  Full responsibility of the confidentiality of this discharge information lies with you and/or your care-partner.

## 2019-12-20 NOTE — Progress Notes (Signed)
To PACU, VSS. Report to [email or web address] 

## 2019-12-20 NOTE — Op Note (Signed)
[facility] Patient Name: Nicholas Newman Procedure Date: 12/20/2019 1:22 PM MRN: [id number] Endoscopist: Gerrit Heck , MD Age: 29 Referring MD:  Date of Birth: January 12, 1991 Gender: Male Account #: [id number] Procedure:                Colonoscopy Indications:              Epigastric abdominal pain, Hematochezia, Diarrhea,                            Weight loss                           Additionally, family history notable for father                            with colon cancer prior to age 25. Medicines:                Monitored Anesthesia Care Procedure:                Pre-Anesthesia Assessment:                           - Prior to the procedure, a History and Physical                            was performed, and patient medications and                            allergies were reviewed. The patient's tolerance of                            previous anesthesia was also reviewed. The risks                            and benefits of the procedure and the sedation                            options and risks were discussed with the patient.                            All questions were answered, and informed consent                            was obtained. Prior Anticoagulants: The patient has                            taken no previous anticoagulant or antiplatelet                            agents. ASA Grade Assessment: II - A patient with                            mild systemic disease. After reviewing the risks  and benefits, the patient was deemed in                            satisfactory condition to undergo the procedure.                           After obtaining informed consent, the colonoscope                            was passed under direct vision. Throughout the                            procedure, the patient's blood pressure, pulse, and                            oxygen saturations were monitored continuously. The                             Colonoscope was introduced through the anus and                            advanced to the the terminal ileum. The colonoscopy                            was performed without difficulty. The patient                            tolerated the procedure well. The quality of the                            bowel preparation was good. The terminal ileum,                            ileocecal valve, appendiceal orifice, and rectum                            were photographed. Scope In: 1:50:22 PM Scope Out: 2:10:02 PM Scope Withdrawal Time: 0 hours 15 minutes 21 seconds  Total Procedure Duration: 0 hours 19 minutes 40 seconds  Findings:                 Hemorrhoids were found on perianal exam.                           A 3 mm polyp was found in the rectum. The polyp was                            sessile. The polyp was removed with a cold biopsy                            forceps. Resection and retrieval were complete.                            Estimated blood loss was minimal.  A single (solitary) three mm ulcer was found in the                            sigmoid colon. This appeared to be co-located with                            an everted diverticulum. No bleeding was present.                            Biopsies were taken with a cold forceps for                            histology. Estimated blood loss was minimal.                           Multiple small and large-mouthed diverticula were                            found in the sigmoid colon, descending colon,                            transverse colon and ascending colon.                           The mucosa was otherwise normal appearing                            throughout the remainder of the colon. Biopsies for                            histology were taken with a cold forceps from the                            right colon and left colon for evaluation of                             microscopic colitis. Estimated blood loss was                            minimal.                           Non-bleeding internal hemorrhoids were found during                            retroflexion. The hemorrhoids were small and Grade                            II (internal hemorrhoids that prolapse but reduce                            spontaneously).                           The  terminal ileum appeared normal. Complications:            No immediate complications. Estimated Blood Loss:     Estimated blood loss was minimal. Impression:               - Hemorrhoids found on perianal exam.                           - One 3 mm polyp in the rectum, removed with a cold                            biopsy forceps. Resected and retrieved.                           - A single (solitary) ulcer in the sigmoid colon.                            Biopsied.                           - Diverticulosis in the sigmoid colon, in the                            descending colon, in the transverse colon and in                            the ascending colon.                           - Normal mucosa in the entire examined colon.                            Biopsied.                           - Non-bleeding internal hemorrhoids.                           - The examined portion of the ileum was normal. Recommendation:           - Patient has a contact number available for                            emergencies. The signs and symptoms of potential                            delayed complications were discussed with the                            patient. Return to normal activities tomorrow.                            Written discharge instructions were provided to the                            patient.                           -  Resume previous diet.                           - Continue present medications.                           - Await pathology results.                           - Follow-up with Dr.  Carlean Purl in the GI clinic at                            appointment to be scheduled. Gerrit Heck, MD 12/20/2019 2:24:48 PM

## 2019-12-20 NOTE — Progress Notes (Signed)
Called to room to assist during endoscopic procedure.  Patient ID and intended procedure confirmed with present staff. Received instructions for my participation in the procedure from the performing physician.  

## 2019-12-24 ENCOUNTER — Telehealth: Payer: Self-pay | Admitting: *Deleted

## 2019-12-24 ENCOUNTER — Telehealth: Payer: Self-pay

## 2019-12-24 NOTE — Telephone Encounter (Signed)
First attempt follow up call to pt, lm on vm 

## 2019-12-24 NOTE — Telephone Encounter (Signed)
  Follow up Call-  Call back number 12/20/2019  Post procedure Call Back phone  # [phone]  Permission to leave phone message Yes  Some recent data might be hidden     Patient questions:  Do you have a fever, pain , or abdominal swelling? Yes.   Pain Score  3 *  Have you tolerated food without any problems? Yes.    Have you been able to return to your normal activities? Yes.    Do you have any questions about your discharge instructions: Diet   No. Medications  No. Follow up visit  No.  Do you have questions or concerns about your Care? Yes.    PT experiencing abdominal discomfort after he eats or anytime he has to use those abdominal muscles. Currently just waking up he was 3/10. Told the patient we are still waiting on bx results but will be in contact with those.  Actions: * If pain score is 4 or above: No action needed, pain <4.

## 2019-12-24 NOTE — Telephone Encounter (Signed)
Agree, no additional action needed at this time as that abd pain was also the indication for the study. Thanks.

## 2019-12-31 ENCOUNTER — Encounter: Payer: Self-pay | Admitting: Gastroenterology

## 2020-01-02 ENCOUNTER — Telehealth: Payer: Self-pay | Admitting: Gastroenterology

## 2020-01-02 NOTE — Telephone Encounter (Signed)
Patient called stated he would like to follow up with the nurse still having abdominal pain since the procedure. Has not heard any results nor plan for further treatment

## 2020-01-02 NOTE — Telephone Encounter (Signed)
LMOM for patient to call back.

## 2020-09-29 ENCOUNTER — Other Ambulatory Visit: Payer: Self-pay

## 2020-09-29 ENCOUNTER — Other Ambulatory Visit (INDEPENDENT_AMBULATORY_CARE_PROVIDER_SITE_OTHER): Payer: Self-pay

## 2020-09-29 ENCOUNTER — Ambulatory Visit (INDEPENDENT_AMBULATORY_CARE_PROVIDER_SITE_OTHER): Payer: Self-pay | Admitting: Gastroenterology

## 2020-09-29 ENCOUNTER — Encounter: Payer: Self-pay | Admitting: Gastroenterology

## 2020-09-29 VITALS — BP 128/68 | HR 70 | Ht 71.0 in | Wt 173.4 lb

## 2020-09-29 DIAGNOSIS — R1084 Generalized abdominal pain: Secondary | ICD-10-CM

## 2020-09-29 DIAGNOSIS — K641 Second degree hemorrhoids: Secondary | ICD-10-CM

## 2020-09-29 DIAGNOSIS — Z800 Family history of malignant neoplasm of digestive organs: Secondary | ICD-10-CM

## 2020-09-29 DIAGNOSIS — K580 Irritable bowel syndrome with diarrhea: Secondary | ICD-10-CM

## 2020-09-29 DIAGNOSIS — R109 Unspecified abdominal pain: Secondary | ICD-10-CM

## 2020-09-29 DIAGNOSIS — K921 Melena: Secondary | ICD-10-CM

## 2020-09-29 MED ORDER — DICYCLOMINE HCL 10 MG PO CAPS: 10.0000 mg | ORAL_CAPSULE | Freq: Four times a day (QID) | ORAL | 3 refills | Status: DC | PRN

## 2020-09-29 NOTE — Patient Instructions (Addendum)
If you are age 29 or older, your body mass index should be between 23-30. Your Body mass index is 24.18 kg/m. If this is out of the aforementioned range listed, please consider follow up with your Primary Care Provider.  If you are age 6 or younger, your body mass index should be between 19-25. Your Body mass index is 24.18 kg/m. If this is out of the aformentioned range listed, please consider follow up with your Primary Care Provider.   Please go to the 2nd floor of this building today and schedule your labwork, [address], [address].  We have sent the following medications to your pharmacy for you to pick up at your convenience:  Dicyclomine 10mg  take 1 tablets every 6 hours as needed for abdominal pain.  Please purchase the following medications over the counter and take as directed:  FD Guard- 2 sample boxes this may be purchased over the counter. 2 tabs twice daily 30-60 minutes before meals.  Patient scheduled for 1st hemorrhoid banding on 10/03/2020 .  Thank you for choosing me and [facility].  Vito Cirigliano, D.O.

## 2020-09-29 NOTE — Progress Notes (Signed)
Chief Complaint:    Hematochezia, nausea, abdominal pain  GI History: 30 year old male initially seen in the GI clinic by Dr. Carlean Purl on 12/14/2019 for evaluation of watery diarrhea, intermittent hematochezia, weight loss (subjective), upper abdominal pain, bloating.  Evaluation to date notable for the following: - 10/2016: Normal CBC, CMP, ESR - 02/2017: Negative H. pylori breath test - 12/2019: Normal TTG, IgA, CBC, CMP - EGD (12/2019): Normal.  Normal gastric/duodenal biopsies - Colonoscopy (12/2019): 3 mm rectal hyperplastic polyp, 3 mm ulcer in the sigmoid (path benign), pandiverticulosis.  Otherwise normal mucosa (path benign, no MC, IBD).  Grade 2 internal hemorrhoids.  Normal TI   GI symptoms started around 08-May-2016.  Was seen in the ER in 05/08/2016 for rectal bleeding and internal hemorrhoid.  Again seen in ER in 02/2019 and CT at that time with nonspecific enlarged right inguinal lymph nodes but otherwise normal GI tract.   Family history notable for father with stage IV colon cancer diagnosed at age 24; deceased May 08, 2020.  HPI:     Patient is a 30 y.o. male presenting to the Gastroenterology Clinic for follow-up.  Initially seen by Dr. Carlean Purl in 12/2019, with completion of EGD/colonoscopy that month which were largely unrevealing as above.  No follow-up since then and no new labs or imaging for review.  He continues to have multiple GI symptoms, to include upper abdominal pain with associated nausea  w/o emesis, alternating bowel habits, and intermittent BRBPR.  +fecal urgency.   No longer working nights at [employer] working at [employer].  Exercising regularly.  Maintains very healthy diet limited to chicken, fish, bison, along with regular vegetables.  Eating 6 meals/day. Weight still fluctuates. Taking digestive enzymes with some improvement. Started Prilosec OTC with some improvement. Taking MVI. Uses fiber supplement intermittently.   Father died in May 08, 2022 from [facility].   Reports weight  loss, but weight is 173# and was 161# in 12/2019.  More so describes weight fluctuation within that range.  No night sweats or fevers.   Review of systems:     No chest pain, no SOB, no fevers, no urinary sx   Past Medical History:  Diagnosis Date   Allergy    Foods   GERD (gastroesophageal reflux disease)    Singultus     Patient's surgical history, family medical history, social history, medications and allergies were all reviewed in Epic    Current Outpatient Medications  Medication Sig Dispense Refill   Digestive Enzymes (DIGESTIVE ENZYME PO) Take 1 capsule by mouth 2 (two) times daily with a meal.     Multiple Vitamin (MULTIVITAMIN) tablet Take 1 tablet by mouth daily.     omeprazole (PRILOSEC) 20 MG capsule Take 20 mg by mouth daily.     No current facility-administered medications for this visit.    Physical Exam:     BP 128/68   Pulse 70   Ht 5' 11" (1.803 m)   Wt 173 lb 6 oz (78.6 kg)   SpO2 99%   BMI 24.18 kg/m   GENERAL:  Pleasant male in NAD PSYCH: : Cooperative, normal affect NEURO: Alert and oriented x 3, no focal neurologic deficits Rectal: Exam deferred by patient to time of hemorrhoid banding   IMPRESSION and PLAN:    1) Hematochezia/Internal hemorrhoids - Clinical symptoms c/w intermittent hemorrhoid bleeding.  Internal hemorrhoids noted at time of colonoscopy - Discussed at length today and he would like to proceed with hemorrhoid banding at a future appointment - Schedule follow-up  for hemorrhoid banding - Continue conservative management in the interim  2) IBS 3) Nonulcer dyspepsia - Trial course of FD guard - Trial course of Bentyl 10 mg prn - Already eating healthy diet.  Not sure more limitations needed at this time - Continue regular exercise - If symptoms persist, could consider repeat cross-sectional imaging  4) Family history of colon cancer - His father unfortunately died earlier this year from colon cancer, diagnosed in his late  5s - Repeat colonoscopy in 2025 - Patient is already encouraged his older siblings to start [place] screening  I spent 35 minutes of time, including in depth chart review, independent review of results as outlined above, communicating results with the patient directly, face-to-face time with the patient, coordinating care, and ordering studies and medications as appropriate, and documentation.           [place] ,DO, FACG 09/29/2020, 2:06 PM

## 2020-09-30 LAB — CBC
HCT: 41.5 % (ref 39.0–52.0)
Hemoglobin: 14.1 g/dL (ref 13.0–17.0)
MCHC: 34.1 g/dL (ref 30.0–36.0)
MCV: 95.4 fl (ref 78.0–100.0)
Platelets: 200 10*3/uL (ref 150.0–400.0)
RBC: 4.35 Mil/uL (ref 4.22–5.81)
RDW: 12.5 % (ref 11.5–15.5)
WBC: 5.8 10*3/uL (ref 4.0–10.5)

## 2020-10-03 ENCOUNTER — Other Ambulatory Visit: Payer: Self-pay

## 2020-10-03 ENCOUNTER — Encounter: Payer: Self-pay | Admitting: Gastroenterology

## 2020-10-03 ENCOUNTER — Ambulatory Visit (INDEPENDENT_AMBULATORY_CARE_PROVIDER_SITE_OTHER): Payer: Self-pay | Admitting: Gastroenterology

## 2020-10-03 VITALS — BP 118/78 | HR 78 | Ht 71.0 in | Wt 173.1 lb

## 2020-10-03 DIAGNOSIS — K641 Second degree hemorrhoids: Secondary | ICD-10-CM

## 2020-10-03 NOTE — Progress Notes (Signed)
Chief Complaint:    Symptomatic Internal Hemorrhoids; Hemorrhoid Band Ligation  GI History: 30 year old male with a history of nonulcer dyspepsia and IBS complicated by symptomatic internal hemorrhoids.  Index symptoms of watery diarrhea, intermittent hematochezia, weight fluctuation, upper abdominal pain, bloating.  Symptoms started 2018.  Evaluation to date notable for the following: - 10/2016: Normal CBC, CMP, ESR - 02/2017: Negative H. pylori breath test - 02/2019: CT abdomen/pelvis: Nonspecific enlarged right inguinal lymph nodes but otherwise normal GI tract - 12/2019: Normal TTG, IgA, CBC, CMP - EGD (12/2019): Normal.  Normal gastric/duodenal biopsies - Colonoscopy (12/2019): 3 mm rectal hyperplastic polyp, 3 mm ulcer in the sigmoid (path benign), pandiverticulosis.  Otherwise normal mucosa (path benign, no MC, IBD).  Grade 2 internal hemorrhoids.  Normal TI   Has had clinical improvement with trial of FD guard and prn Bentyl.    Family history notable for father with stage IV colon cancer diagnosed at age 28; deceased 03-30-20.  HPI:     Patient is a 30 y.o. malewith a history of symptomatic internal hemorrhoids presenting to the Gastroenterology Clinic for follow-up and ongoing treatment. The patient presents with symptomatic grade 2 hemorrhoids, unresponsive to maximal medical therapy, requesting rubber band ligation of symptomatic hemorrhoidal disease.  He states his IBS symptoms have significantly improved since starting FD guard earlier this week.  Also with Rx for Bentyl prn breakthrough.  Did have episode of hematochezia earlier this morning.  No change in medical or surgical history, medications, allergies, social history since last appointment with me.   Review of systems:     No chest pain, no SOB, no fevers, no urinary sx   Past Medical History:  Diagnosis Date   Allergy    Foods   GERD (gastroesophageal reflux disease)    Singultus     Patient's surgical  history, family medical history, social history, medications and allergies were all reviewed in Epic    Current Outpatient Medications  Medication Sig Dispense Refill   dicyclomine (BENTYL) 10 MG capsule Take 1 capsule (10 mg total) by mouth every 6 (six) hours as needed for spasms. 60 capsule 3   Digestive Enzymes (DIGESTIVE ENZYME PO) Take 1 capsule by mouth 2 (two) times daily with a meal.     Multiple Vitamin (MULTIVITAMIN) tablet Take 1 tablet by mouth daily.     omeprazole (PRILOSEC) 20 MG capsule Take 20 mg by mouth daily.     No current facility-administered medications for this visit.    Physical Exam:     BP 118/78   Pulse 78   Ht '5\' 11"'  (1.803 m)   Wt 173 lb 2 oz (78.5 kg)   SpO2 98%   BMI 24.15 kg/m   GENERAL:  Pleasant male in NAD PSYCH: : Cooperative, normal affect NEURO: Alert and oriented x 3, no focal neurologic deficits Rectal exam: Sensation intact and preserved anal wink.  Grade 2 hemorrhoids noted in all positions on anoscopy.  No external anal fissures noted. Normal sphincter tone. No palpable mass. No blood on the exam glove. (Chaperone: Renee Rival CMA).   IMPRESSION and PLAN:    #1.  Symptomatic internal hemorrhoids: PROCEDURE NOTE: The patient presents with symptomatic grade 2 hemorrhoids, unresponsive to maximal medical therapy, requesting rubber band ligation of symptomatic hemorrhoidal disease.  All risks, benefits and alternative forms of therapy were described and informed consent was obtained.  In the Left Lateral Decubitus position, anoscopic examination revealed grade 2 hemorrhoids in the all position(s).  The  anorectum was pre-medicated with RectiCare. The decision was made to band the LL internal hemorrhoid, and the [place] was used to perform band ligation without complication.  Digital anorectal examination was then performed to assure proper positioning of the band, and to adjust the banded tissue as required.  The patient was  discharged home without pain or other issues.  Dietary and behavioral recommendations were given and along with follow-up instructions.     The following adjunctive treatments were recommended:  -Resume high-fiber diet with fiber supplement (i.e. Citrucel or Benefiber) with goal for soft stools without straining to have a BM. -Resume adequate fluid intake.  The patient will return in 2-4 for  follow-up and possible additional banding as required. No complications were encountered and the patient tolerated the procedure well.      #2.  IBS #3.  Nonulcer dyspepsia - Resume FD guard and Bentyl - Resume healthy eating habits  #4. Family history of colon cancer - His father unfortunately died earlier this year from colon cancer, diagnosed in his late 69s - Repeat colonoscopy in 2025 - Patient has already encouraged his older siblings to start [place] screening      Lavena Bullion ,DO, [place] 10/03/2020, 11:24 AM

## 2020-10-03 NOTE — Patient Instructions (Signed)
If you are age 29 or older, your body mass index should be between 23-30. Your Body mass index is 24.15 kg/m. If this is out of the aforementioned range listed, please consider follow up with your Primary Care Provider.  If you are age 84 or younger, your body mass index should be between 19-25. Your Body mass index is 24.15 kg/m. If this is out of the aformentioned range listed, please consider follow up with your Primary Care Provider.    HEMORRHOID BANDING PROCEDURE    FOLLOW-UP CARE   The procedure you have had should have been relatively painless since the banding of the area involved does not have nerve endings and there is no pain sensation.  The rubber band cuts off the blood supply to the hemorrhoid and the band may fall off as soon as 48 hours after the banding (the band may occasionally be seen in the toilet bowl following a bowel movement). You may notice a temporary feeling of fullness in the rectum which should respond adequately to plain Tylenol or Motrin.  Following the banding, avoid strenuous exercise that evening and resume full activity the next day.  A sitz bath (soaking in a warm tub) or bidet is soothing, and can be useful for cleansing the area after bowel movements.     To avoid constipation, take two tablespoons of natural wheat bran, natural oat bran, flax, Benefiber or any over the counter fiber supplement and increase your water intake to 7-8 glasses daily.    Unless you have been prescribed anorectal medication, do not put anything inside your rectum for two weeks: No suppositories, enemas, fingers, etc.  Occasionally, you may have more bleeding than usual after the banding procedure.  This is often from the untreated hemorrhoids rather than the treated one.  Don't be concerned if there is a tablespoon or so of blood.  If there is more blood than this, lie flat with your bottom higher than your head and apply an ice pack to the area. If the bleeding does not stop  within a half an hour or if you feel faint, call our office at [phone] or go to the emergency room.  Problems are not common; however, if there is a substantial amount of bleeding, severe pain, chills, fever or difficulty passing urine (very rare) or other problems, you should call us at [phone] or report to the nearest emergency room.  Do not stay seated continuously for more than 2-3 hours for a day or two after the procedure.  Tighten your buttock muscles 10-15 times every two hours and take 10-15 deep breaths every 1-2 hours.  Do not spend more than a few minutes on the toilet if you cannot empty your bowel; instead re-visit the toilet at a later time.  Due to recent changes in healthcare laws, you may see the results of your imaging and laboratory studies on MyChart before your provider has had a chance to review them.  We understand that in some cases there may be results that are confusing or concerning to you. Not all laboratory results come back in the same time frame and the provider may be waiting for multiple results in order to interpret others.  Please give [place] 48 hours in order for your provider to thoroughly review all the results before contacting the office for clarification of your results.    Thank you for choosing me and [facility].  Vito Cirigliano, D.O.

## 2020-10-30 ENCOUNTER — Other Ambulatory Visit: Payer: Self-pay

## 2020-10-30 ENCOUNTER — Ambulatory Visit (INDEPENDENT_AMBULATORY_CARE_PROVIDER_SITE_OTHER): Payer: Self-pay | Admitting: Gastroenterology

## 2020-10-30 ENCOUNTER — Encounter: Payer: Self-pay | Admitting: Gastroenterology

## 2020-10-30 DIAGNOSIS — K641 Second degree hemorrhoids: Secondary | ICD-10-CM

## 2020-10-30 NOTE — Patient Instructions (Signed)

## 2020-10-30 NOTE — Progress Notes (Signed)
Chief Complaint:    Symptomatic Internal Hemorrhoids; Hemorrhoid Band Ligation  GI History: 30 year old male with a history of nonulcer dyspepsia and IBS complicated by symptomatic internal hemorrhoids.  Index symptoms of watery diarrhea, intermittent hematochezia, weight fluctuation, upper abdominal pain, bloating.  Symptoms started 2018.  Evaluation to date notable for the following: - 10/2016: Normal CBC, CMP, ESR - 02/2017: Negative H. pylori breath test - 02/2019: CT abdomen/pelvis: Nonspecific enlarged right inguinal lymph nodes but otherwise normal GI tract - 12/2019: Normal TTG, IgA, CBC, CMP - EGD (12/2019): Normal.  Normal gastric/duodenal biopsies - Colonoscopy (12/2019): 3 mm rectal hyperplastic polyp, 3 mm ulcer in the sigmoid (path benign), pandiverticulosis.  Otherwise normal mucosa (path benign, no MC, IBD).  Grade 2 internal hemorrhoids.  Normal TI   Has had clinical improvement with trial of FD guard and prn Bentyl.    Family history notable for father with stage IV colon cancer diagnosed at age 41; deceased 03/28/20.  History of symptomatic hemorrhoids as outlined above. - 10/03/2020: Successful banding of the LL hemorrhoid - 10/30/2020: Presents for hemorrhoid banding #2  HPI:     Patient is a 30 y.o. malewith a history of symptomatic internal hemorrhoids presenting to the [facility]astroenterology Clinic for follow-up and ongoing treatment. The patient presents with symptomatic grade 2 hemorrhoids, unresponsive to maximal medical therapy, requesting rubber band ligation of symptomatic hemorrhoidal disease.  Did well with the first hemorrhoid banding on 10/03/2020.  Presents today for hemorrhoid banding #2.  His IBS symptoms are much improved since starting OTC probiotic.  Overall feels much better.  No change in medical or surgical history, medications, allergies, social history since last appointment with me.   Review of systems:     No chest pain, no SOB, no fevers, no  urinary sx   Past Medical History:  Diagnosis Date   Allergy    Foods   GERD (gastroesophageal reflux disease)    Singultus     Patient's surgical history, family medical history, social history, medications and allergies were all reviewed in Epic    Current Outpatient Medications  Medication Sig Dispense Refill   Digestive Enzymes (DIGESTIVE ENZYME PO) Take 1 capsule by mouth 2 (two) times daily with a meal.     Multiple Vitamin (MULTIVITAMIN) tablet Take 1 tablet by mouth daily.     omeprazole (PRILOSEC) 20 MG capsule Take 20 mg by mouth daily.     dicyclomine (BENTYL) 10 MG capsule Take 1 capsule (10 mg total) by mouth every 6 (six) hours as needed for spasms. 60 capsule 3   No current facility-administered medications for this visit.    Physical Exam:     BP 118/76   Pulse 76   Ht '5\' 11"'  (1.803 m)   Wt 168 lb (76.2 kg)   SpO2 99%   BMI 23.43 kg/m   GENERAL:  Pleasant male in NAD PSYCH: : Cooperative, normal affect NEURO: Alert and oriented x 3, no focal neurologic deficits Rectal exam: Sensation intact and preserved anal wink.  Grade 2 hemorrhoids noted in RP/RA positions.  No external anal fissures noted. Normal sphincter tone. No palpable mass. No blood on the exam glove. (Chaperone: Curlene Labrum, CMA).   IMPRESSION and PLAN:    #1.  Symptomatic internal hemorrhoids: PROCEDURE NOTE: The patient presents with symptomatic grade 2 hemorrhoids, unresponsive to maximal medical therapy, requesting rubber band ligation of symptomatic hemorrhoidal disease.  All risks, benefits and alternative forms of therapy were described and informed consent was obtained.  In the Left Lateral Decubitus position, anoscopic examination revealed grade 2 hemorrhoids in the RP/RA position(s).  The anorectum was pre-medicated with RectiCare. The decision was made to band the RP internal hemorrhoid, and the [place] was used to perform band ligation without complication.  Digital  anorectal examination was then performed to assure proper positioning of the band, and to adjust the banded tissue as required.  The patient was discharged home without pain or other issues.  Dietary and behavioral recommendations were given and along with follow-up instructions.     The following adjunctive treatments were recommended:  -Resume high-fiber diet with fiber supplement (i.e. Citrucel or Benefiber) with goal for soft stools without straining to have a BM. -Resume adequate fluid intake.  The patient will return in 4 for  follow-up and possible additional banding as required. No complications were encountered and the patient tolerated the procedure well.      #2.  IBS - Continue probiotics as these are providing good clinical improvement - Continue Bentyl prn - Continue active lifestyle with healthy eating as currently doing      Lavena Bullion ,DO, FACG 10/30/2020, 3:38 PM

## 2020-11-06 ENCOUNTER — Ambulatory Visit: Payer: Self-pay | Admitting: Gastroenterology

## 2020-11-30 IMAGING — CT CT ABD-PELV W/ CM
2 of 4 series · 16 of 46 positions shown, 18 images · IV contrast (Omnipaque)
Comparison: None.

CLINICAL DATA: Inguinal lymphadenopathy

EXAM:
CT ABDOMEN AND PELVIS WITH CONTRAST
TECHNIQUE: Multidetector CT imaging of the abdomen and pelvis was performed
using the standard protocol following bolus administration of
intravenous contrast.
CONTRAST:  100mL OMNIPAQUE IOHEXOL 300 MG/ML SOLN, additional oral
enteric contrast

[Series 2: axial st · axial · 0.74mm/px · z∈[-598,-143]mm · 13 of 101 slices shown, 15 images]
[im 5/101  soft-tissue]
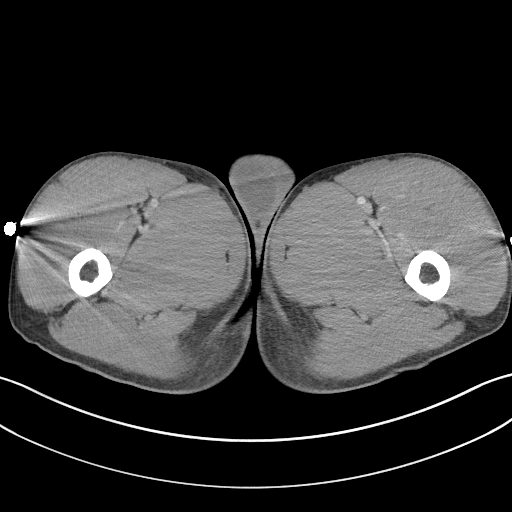
[im 5/101  bone]
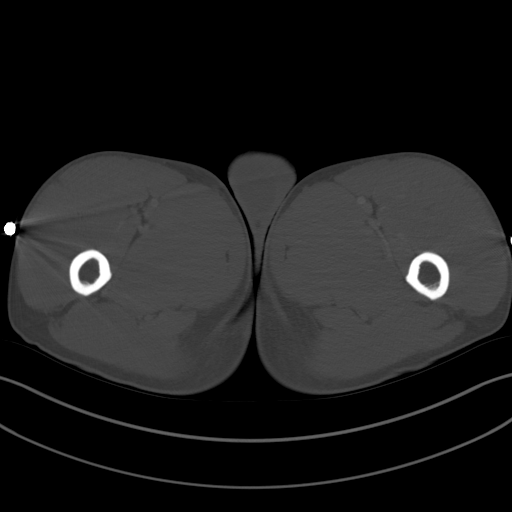
[im 13/101  soft-tissue]
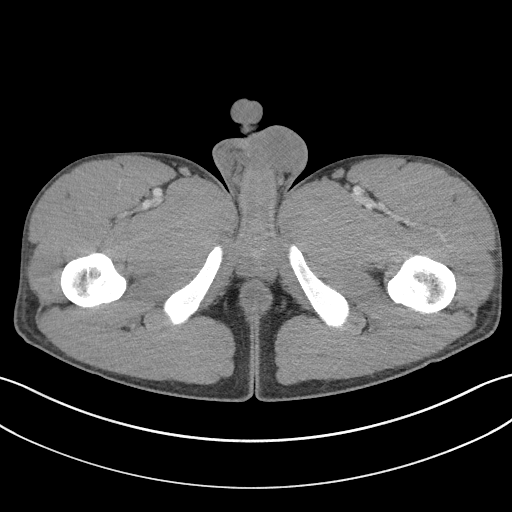
[im 21/101  soft-tissue]
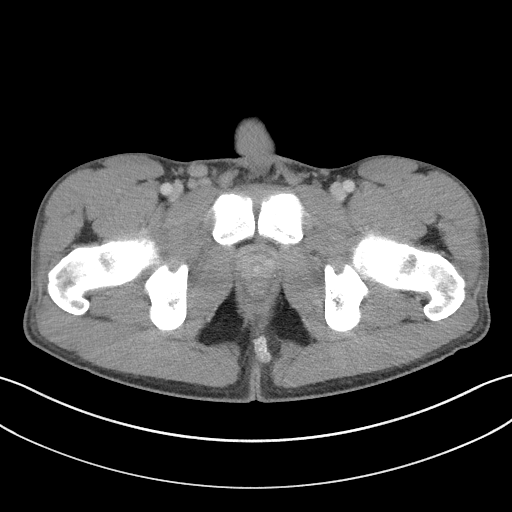
[im 30/101  soft-tissue]
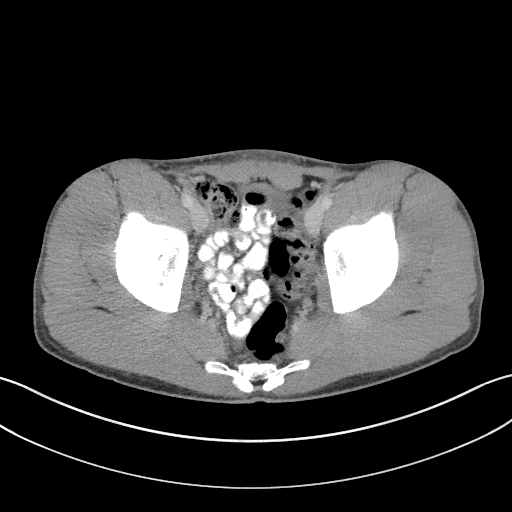
[im 34/101  soft-tissue]
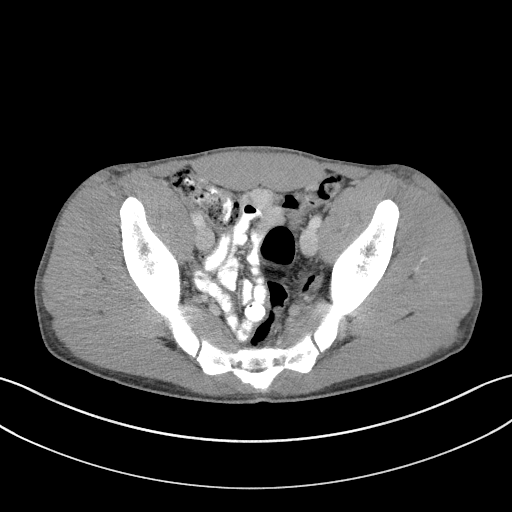
[im 42/101  soft-tissue]
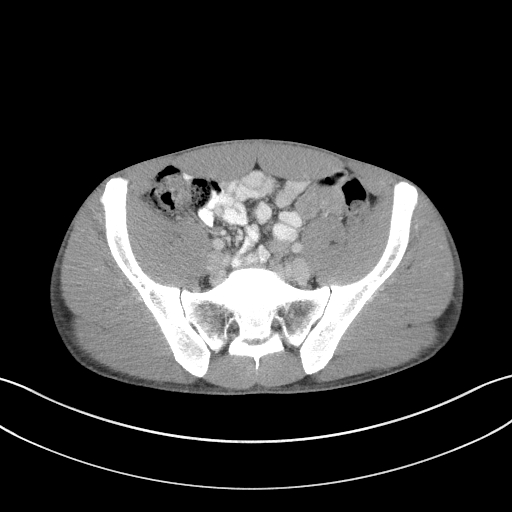
[im 51/101  soft-tissue]
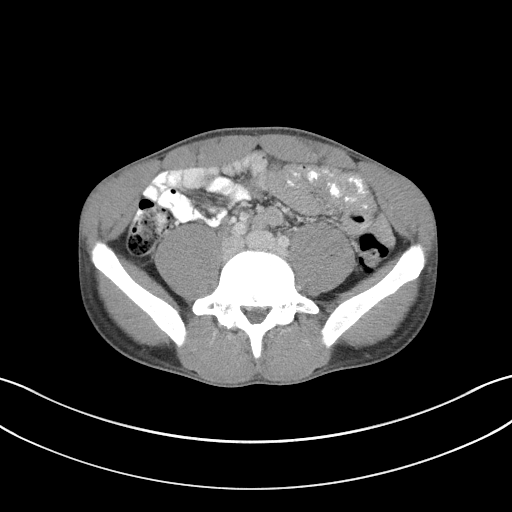
[im 59/101  soft-tissue]
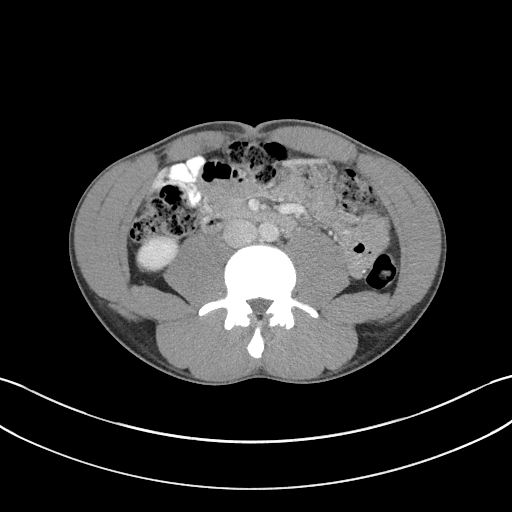
[im 67/101  soft-tissue]
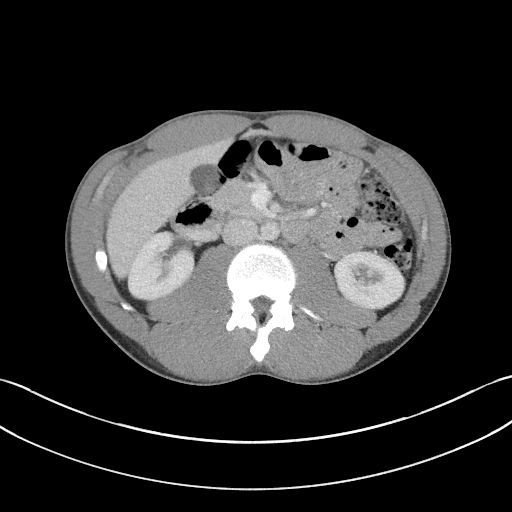
[im 67/101  bone]
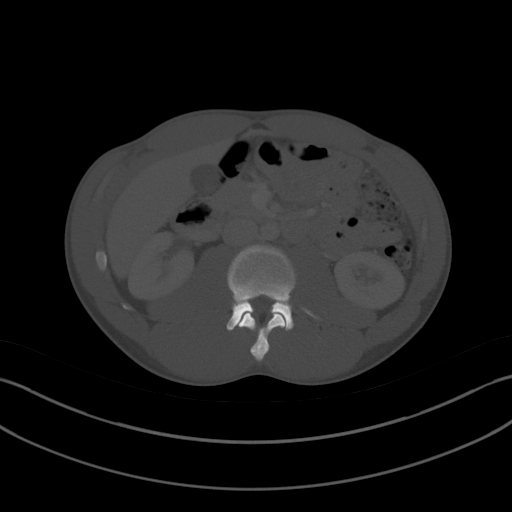
[im 71/101  soft-tissue]
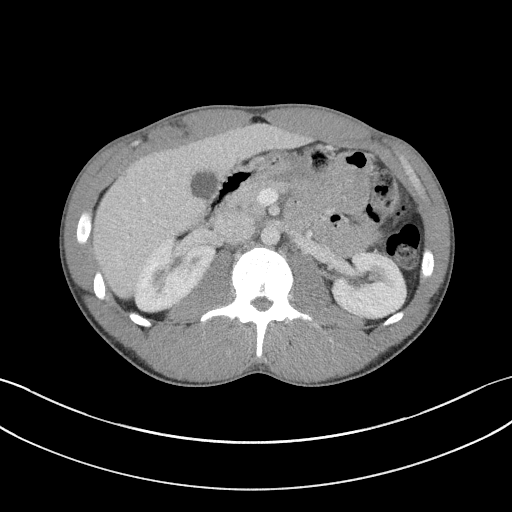
[im 80/101  soft-tissue]
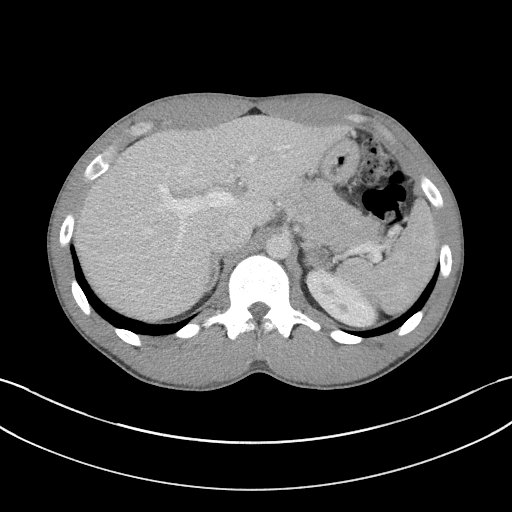
[im 88/101  soft-tissue]
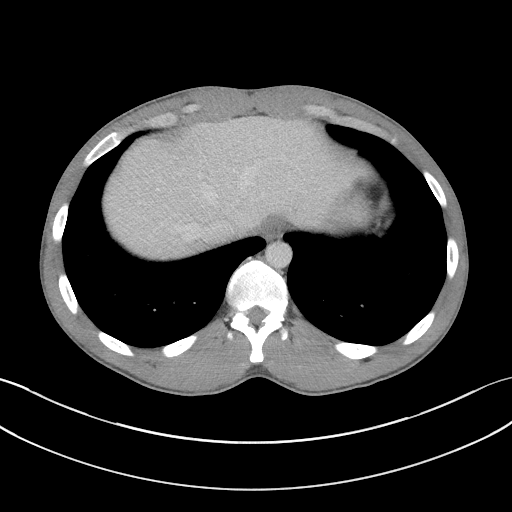
[im 96/101  soft-tissue]
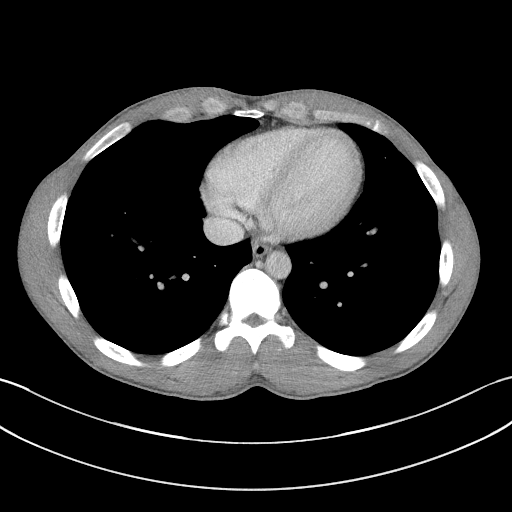

[Series 5: coronal st · coronal · 0.80mm/px · 3 of 81 slices shown]
[im 27/81  soft-tissue]
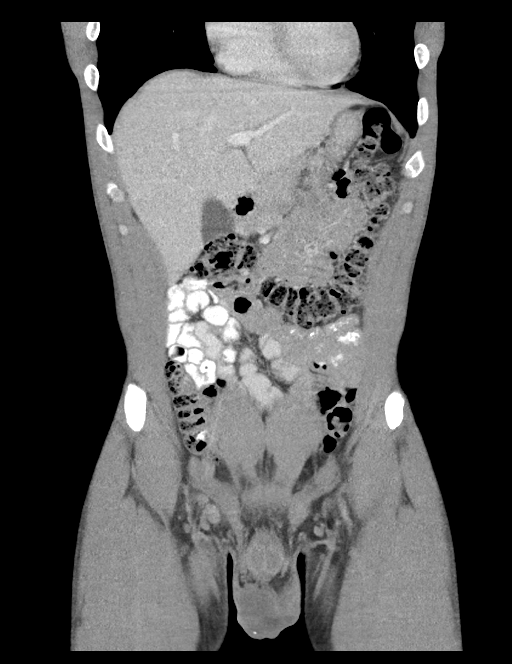
[im 36/81  soft-tissue]
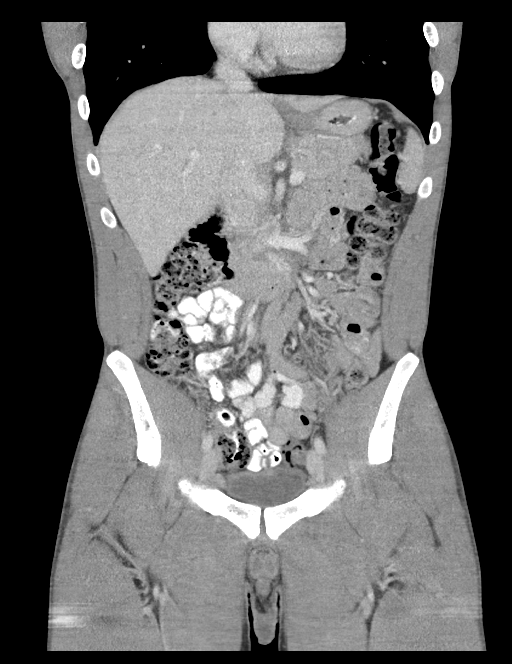
[im 45/81  soft-tissue]
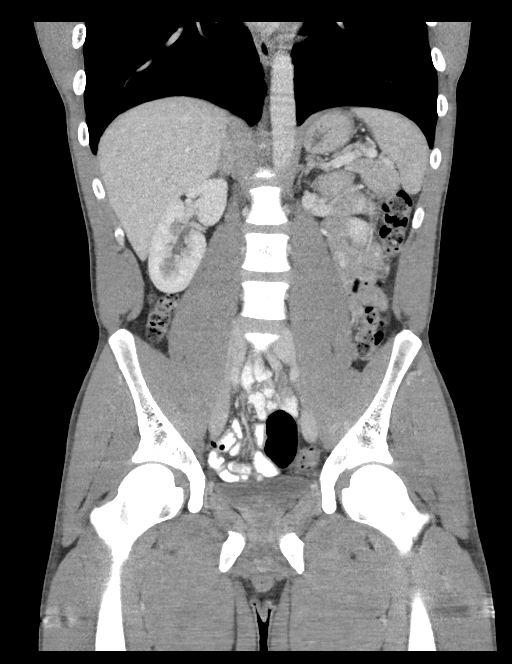

[16 of 46 positions shown; findings below may reference images not displayed]

FINDINGS: Lower chest: No acute abnormality.

Hepatobiliary: No solid liver abnormality is seen. Subcentimeter
subcapsular cyst or hemangioma of the right liver dome (series 2,
image 20). No gallstones, gallbladder wall thickening, or biliary
dilatation.

Pancreas: Unremarkable. No pancreatic ductal dilatation or
surrounding inflammatory changes.

Spleen: Normal in size without significant abnormality.

Adrenals/Urinary Tract: Adrenal glands are unremarkable. Kidneys are
normal, without renal calculi, solid lesion, or hydronephrosis.
Bladder is unremarkable.

Stomach/Bowel: Stomach is within normal limits. Appendix appears
normal. No evidence of bowel wall thickening, distention, or
inflammatory changes.

Vascular/Lymphatic: No significant vascular findings are present.
There are enlarged right inguinal lymph nodes measuring up to 1.9 x
1.1 cm (series 2, image 80). There are no enlarged lymph nodes
within the abdomen or pelvis.

Reproductive: No mass or other significant abnormality.

Other: No abdominal wall hernia or abnormality. No abdominopelvic
ascites.

Musculoskeletal: No acute or significant osseous findings.
IMPRESSION: 1. There are nonspecific enlarged right inguinal lymph nodes
measuring up to 1.9 x 1.1 cm.
2. No enlarged lymph nodes or other abnormal findings within the
abdominal or pelvic cavities.

## 2020-12-04 ENCOUNTER — Encounter: Payer: Self-pay | Admitting: Gastroenterology

## 2020-12-04 ENCOUNTER — Ambulatory Visit (INDEPENDENT_AMBULATORY_CARE_PROVIDER_SITE_OTHER): Payer: Self-pay | Admitting: Gastroenterology

## 2020-12-04 ENCOUNTER — Other Ambulatory Visit: Payer: Self-pay

## 2020-12-04 VITALS — BP 118/70 | HR 65 | Ht 71.0 in | Wt 163.5 lb

## 2020-12-04 DIAGNOSIS — K649 Unspecified hemorrhoids: Secondary | ICD-10-CM

## 2020-12-04 NOTE — Patient Instructions (Signed)
If you are age 29 or older, your body mass index should be between 23-30. Your Body mass index is 22.8 kg/m. If this is out of the aforementioned range listed, please consider follow up with your Primary Care Provider.  If you are age 89 or younger, your body mass index should be between 19-25. Your Body mass index is 22.8 kg/m. If this is out of the aformentioned range listed, please consider follow up with your Primary Care Provider.   __________________________________________________________  The [facility] providers would like to encourage you to use [facility] to communicate with providers for non-urgent requests or questions.  Due to long hold times on the telephone, sending your provider a message by [facility] may be a faster and more efficient way to get a response.  Please allow 48 business hours for a response.  Please remember that this is for non-urgent requests.   HEMORRHOID BANDING PROCEDURE    FOLLOW-UP CARE   The procedure you have had should have been relatively painless since the banding of the area involved does not have nerve endings and there is no pain sensation.  The rubber band cuts off the blood supply to the hemorrhoid and the band may fall off as soon as 48 hours after the banding (the band may occasionally be seen in the toilet bowl following a bowel movement). You may notice a temporary feeling of fullness in the rectum which should respond adequately to plain Tylenol or Motrin.  Following the banding, avoid strenuous exercise that evening and resume full activity the next day.  A sitz bath (soaking in a warm tub) or bidet is soothing, and can be useful for cleansing the area after bowel movements.     To avoid constipation, take two tablespoons of natural wheat bran, natural oat bran, flax, Benefiber or any over the counter fiber supplement and increase your water intake to 7-8 glasses daily.    Unless you have been prescribed anorectal medication, do not put  anything inside your rectum for two weeks: No suppositories, enemas, fingers, etc.  Occasionally, you may have more bleeding than usual after the banding procedure.  This is often from the untreated hemorrhoids rather than the treated one.  Don't be concerned if there is a tablespoon or so of blood.  If there is more blood than this, lie flat with your bottom higher than your head and apply an ice pack to the area. If the bleeding does not stop within a half an hour or if you feel faint, call our office at [phone] or go to the emergency room.  Problems are not common; however, if there is a substantial amount of bleeding, severe pain, chills, fever or difficulty passing urine (very rare) or other problems, you should call us at [phone] or report to the nearest emergency room.  Do not stay seated continuously for more than 2-3 hours for a day or two after the procedure.  Tighten your buttock muscles 10-15 times every two hours and take 10-15 deep breaths every 1-2 hours.  Do not spend more than a few minutes on the toilet if you cannot empty your bowel; instead re-visit the toilet at a later time.   Please call with any questions or concerns. Follow up as needed.  It was a pleasure to see you today!  Vito Cirigliano, D.O.

## 2020-12-04 NOTE — Progress Notes (Signed)
Chief Complaint:    Symptomatic Internal Hemorrhoids; Hemorrhoid Band Ligation  GI History: 30 year old male with a history of nonulcer dyspepsia and IBS complicated by symptomatic internal hemorrhoids.  Index symptoms of watery diarrhea, intermittent hematochezia, weight fluctuation, upper abdominal pain, bloating.  Symptoms started 2018.  Evaluation to date notable for the following: - 10/2016: Normal CBC, CMP, ESR - 02/2017: Negative H. pylori breath test - 02/2019: CT abdomen/pelvis: Nonspecific enlarged right inguinal lymph nodes but otherwise normal GI tract - 12/2019: Normal TTG, IgA, CBC, CMP - EGD (12/2019): Normal.  Normal gastric/duodenal biopsies - Colonoscopy (12/2019): 3 mm rectal hyperplastic polyp, 3 mm ulcer in the sigmoid (path benign), pandiverticulosis.  Otherwise normal mucosa (path benign, no MC, IBD).  Grade 2 internal hemorrhoids.  Normal TI   Has had clinical improvement with trial of FD guard and prn Bentyl.  IBS symptoms well controlled with OTC probiotic.    Family history notable for father with stage IV colon cancer diagnosed at age 51; deceased 07-Apr-2020.  History of symptomatic hemorrhoids as outlined above. - 10/03/2020: Successful banding of the LL hemorrhoid - 10/30/2020: Successful banding of the RP hemorrhoid - 12/04/2020: Presents for hemorrhoid banding #3  HPI:     Patient is a 30 y.o. malewith a history of symptomatic internal hemorrhoids presenting to the Gastroenterology Clinic for follow-up and ongoing treatment. The patient presents with symptomatic grade 2 hemorrhoids, unresponsive to maximal medical therapy, requesting rubber band ligation of symptomatic hemorrhoidal disease.  Overall feels much better.  Hemorrhoidal symptoms much improved since starting hemorrhoid banding series.  IBS symptoms well controlled and has titrated back off of OTC probiotics without any breakthrough symptoms.  No change in medical or surgical history, medications,  allergies, social history since last appointment with me.   Review of systems:     No chest pain, no SOB, no fevers, no urinary sx   Past Medical History:  Diagnosis Date   Allergy    Foods   GERD (gastroesophageal reflux disease)    Singultus     Patient's surgical history, family medical history, social history, medications and allergies were all reviewed in Epic    Current Outpatient Medications  Medication Sig Dispense Refill   Digestive Enzymes (DIGESTIVE ENZYME PO) Take 1 capsule by mouth 2 (two) times daily with a meal.     Multiple Vitamin (MULTIVITAMIN) tablet Take 1 tablet by mouth daily.     omeprazole (PRILOSEC) 20 MG capsule Take 20 mg by mouth daily.     No current facility-administered medications for this visit.    Physical Exam:     BP 118/70   Pulse 65   Ht '5\' 11"'  (1.803 m)   Wt 163 lb 8 oz (74.2 kg)   SpO2 99%   BMI 22.80 kg/m   GENERAL:  Pleasant male in NAD PSYCH: : Cooperative, normal affect NEURO: Alert and oriented x 3, no focal neurologic deficits Rectal exam: Sensation intact and preserved anal wink.  Grade 2 hemorrhoids noted in all RA position, and noninflamed grade 1 hemorrhoid column in the LL position on anoscopy.  No external anal fissures noted. Normal sphincter tone. No palpable mass. No blood on the exam glove. (Chaperone: Renee Rival, CMA).   IMPRESSION and PLAN:    #1.  Symptomatic internal hemorrhoids: PROCEDURE NOTE: The patient presents with symptomatic grade 2 hemorrhoids, unresponsive to maximal medical therapy, requesting rubber band ligation of symptomatic hemorrhoidal disease.  All risks, benefits and alternative forms of therapy were described  and informed consent was obtained.  In the Left Lateral Decubitus position, anoscopic examination revealed grade 2 hemorrhoids in the RA position, and small column of grade 1 hemorrhoid in the LL position on anoscopy.  The anorectum was pre-medicated with RectiCare. The decision was  made to band the RA internal hemorrhoid and to rebound the LL internal hemorrhoid, and the [place] was used to perform band ligation without complication.  Digital anorectal examination was then performed to assure proper positioning of the band, and to adjust the banded tissue as required.  The patient was discharged home without pain or other issues.  Dietary and behavioral recommendations were given and along with follow-up instructions.     The following adjunctive treatments were recommended:  -Resume high-fiber diet with fiber supplement (i.e. Citrucel or Benefiber) with goal for soft stools without straining to have a BM. -Resume adequate fluid intake. - Avoid dead lift, leg press, squats, etc. and other similar exercises for the time being  The patient will return as needed for follow-up and possible additional banding as required if return of hemorrhoidal symptoms. No complications were encountered and the patient tolerated the procedure well.      #2.  IBS - Symptoms well controlled - Can resume probiotics as needed in the future - Continue Bentyl prn - Continue active lifestyle with healthy eating as currently doing  RTC as needed      Lavena Bullion ,DO, FACG 12/04/2020, 2:59 PM

## 2021-01-13 ENCOUNTER — Telehealth: Payer: Self-pay | Admitting: General Surgery

## 2021-01-13 NOTE — Telephone Encounter (Signed)
Please schedule for OV for evaluation and possible repeat hemorrhoid banding for recurrence of sxs. Thanks.

## 2021-01-13 NOTE — Telephone Encounter (Signed)
Patient sent a mychart message stating he has rectal bleeding again, contacted the patient and left a voicemail for him to call the office to schedule

## 2021-07-27 ENCOUNTER — Ambulatory Visit: Payer: Self-pay | Admitting: Gastroenterology

## 2021-08-05 ENCOUNTER — Ambulatory Visit: Payer: Self-pay | Admitting: Gastroenterology
